# Patient Record
Sex: Male | Born: 1960 | Race: White | Hispanic: No | Marital: Single | Smoking: Current every day smoker
Health system: Southern US, Community
[De-identification: ages and names within clinical notes are randomized; demographics above are authoritative.]

## PROBLEM LIST (undated history)

## (undated) DIAGNOSIS — E039 Hypothyroidism, unspecified: Secondary | ICD-10-CM

## (undated) DIAGNOSIS — F17200 Nicotine dependence, unspecified, uncomplicated: Secondary | ICD-10-CM

## (undated) DIAGNOSIS — R001 Bradycardia, unspecified: Secondary | ICD-10-CM

## (undated) DIAGNOSIS — B182 Chronic viral hepatitis C: Secondary | ICD-10-CM

## (undated) DIAGNOSIS — G8929 Other chronic pain: Secondary | ICD-10-CM

## (undated) DIAGNOSIS — M549 Dorsalgia, unspecified: Secondary | ICD-10-CM

## (undated) HISTORY — DX: Hypothyroidism, unspecified: E03.9

## (undated) HISTORY — DX: Other chronic pain: G89.29

## (undated) HISTORY — DX: Bradycardia, unspecified: R00.1

## (undated) HISTORY — DX: Dorsalgia, unspecified: M54.9

## (undated) HISTORY — DX: Chronic viral hepatitis C: B18.2

## (undated) HISTORY — DX: Nicotine dependence, unspecified, uncomplicated: F17.200

## (undated) HISTORY — PX: MULTIPLE TOOTH EXTRACTIONS: SHX2053

---
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
## 2012-12-06 ENCOUNTER — Encounter (HOSPITAL_COMMUNITY): Payer: Self-pay | Admitting: Emergency Medicine

## 2012-12-06 ENCOUNTER — Emergency Department (HOSPITAL_COMMUNITY): Payer: Self-pay

## 2012-12-06 ENCOUNTER — Emergency Department (HOSPITAL_COMMUNITY)
Admission: EM | Admit: 2012-12-06 | Discharge: 2012-12-06 | Disposition: A | Discharge status: Home / Self Care | Payer: Self-pay | Attending: Emergency Medicine | Admitting: Emergency Medicine

## 2012-12-06 DIAGNOSIS — R112 Nausea with vomiting, unspecified: Secondary | ICD-10-CM | POA: Insufficient documentation

## 2012-12-06 DIAGNOSIS — R300 Dysuria: Secondary | ICD-10-CM | POA: Insufficient documentation

## 2012-12-06 DIAGNOSIS — N200 Calculus of kidney: Secondary | ICD-10-CM | POA: Insufficient documentation

## 2012-12-06 LAB — URINALYSIS, ROUTINE W REFLEX MICROSCOPIC
Bilirubin Urine: NEGATIVE
Glucose, UA: NEGATIVE mg/dL
Nitrite: NEGATIVE
Specific Gravity, Urine: 1.011 (ref 1.005–1.030)
pH: 6.5 (ref 5.0–8.0)

## 2012-12-06 LAB — CBC WITH DIFFERENTIAL/PLATELET
HCT: 39.8 % (ref 39.0–52.0)
Hemoglobin: 13.2 g/dL (ref 13.0–17.0)
Lymphocytes Relative: 27 % (ref 12–46)
Lymphs Abs: 2.5 10*3/uL (ref 0.7–4.0)
Monocytes Absolute: 0.7 10*3/uL (ref 0.1–1.0)
Monocytes Relative: 7 % (ref 3–12)
Neutro Abs: 5.6 10*3/uL (ref 1.7–7.7)
Neutrophils Relative %: 60 % (ref 43–77)
RBC: 4.39 MIL/uL (ref 4.22–5.81)

## 2012-12-06 LAB — BASIC METABOLIC PANEL
BUN: 17 mg/dL (ref 6–23)
CO2: 26 mEq/L (ref 19–32)
Chloride: 104 mEq/L (ref 96–112)
Creatinine, Ser: 1.35 mg/dL (ref 0.50–1.35)
GFR calc Af Amer: 67 mL/min — ABNORMAL LOW (ref >90)
Glucose, Bld: 103 mg/dL — ABNORMAL HIGH (ref 70–99)
Potassium: 4.6 mEq/L (ref 3.5–5.1)

## 2012-12-06 LAB — URINE MICROSCOPIC-ADD ON

## 2012-12-06 MED ORDER — OXYCODONE-ACETAMINOPHEN 5-325 MG PO TABS: 2.0000 | ORAL_TABLET | Freq: Four times a day (QID) | ORAL | Status: DC | PRN

## 2012-12-06 MED ORDER — TAMSULOSIN HCL 0.4 MG PO CAPS: 0.4000 mg | ORAL_CAPSULE | Freq: Every day | ORAL | Status: DC

## 2012-12-06 MED ORDER — ONDANSETRON HCL 4 MG/2ML IJ SOLN
4.0000 mg | Freq: Once | INTRAMUSCULAR | Status: AC
  Administered 2012-12-06: 4 mg via INTRAVENOUS
  Filled 2012-12-06: qty 2

## 2012-12-06 MED ORDER — MORPHINE SULFATE 4 MG/ML IJ SOLN
4.0000 mg | Freq: Once | INTRAMUSCULAR | Status: AC
  Administered 2012-12-06: 4 mg via INTRAVENOUS
  Filled 2012-12-06: qty 1

## 2012-12-06 NOTE — ED Provider Notes (Signed)
History     CSN: [id number]  Arrival date & time 12/06/12  6578   First MD Initiated Contact with Patient 12/06/12 [phone]      Chief Complaint  Patient presents with  . Flank Pain    (Consider location/radiation/quality/duration/timing/severity/associated sxs/prior treatment) HPI Comments: Patient reports to the emergency department with chief complaint of right-sided flank pain x1 day. States that yesterday morning he experienced constant, sharp, right flank pain which has persisted until now. He states that the pain currently is 5/10, but has been as bad as 10 out of 10. He endorses associated nausea and one episode of vomiting. He denies any blood in his vomit. Additionally, he endorses some dysuria, but denies any hematuria. He states that he has no other health problems. He has never felt like this before. He tried taking Tylenol with no relief. He denies headache, fever, chest pain, shortness of breath, abdominal pain, numbness and tingling of the extremities. Nothing makes his symptoms better or worse.  The history is provided by the patient. No language interpreter was used.    No past medical history on file.  No past surgical history on file.  No family history on file.  History  Substance Use Topics  . Smoking status: Not on file  . Smokeless tobacco: Not on file  . Alcohol Use: Not on file      Review of Systems  All other systems reviewed and are negative.    Allergies  Review of patient's allergies indicates not on file.  Home Medications  No current outpatient prescriptions on file.  BP 124/88  Pulse 59  Temp(Src) 97.9 F (36.6 C) (Oral)  Ht 5\' 9"  (1.753 m)  Wt 155 lb (70.308 kg)  BMI 22.88 kg/m2  SpO2 98%  Physical Exam  Nursing note and vitals reviewed. Constitutional: He is oriented to person, place, and time. He appears well-developed and well-nourished.  HENT:  Head: Normocephalic and atraumatic.  Eyes: Conjunctivae and EOM are normal.  Pupils are equal, round, and reactive to light. Right eye exhibits no discharge. Left eye exhibits no discharge. No scleral icterus.  Neck: Normal range of motion. Neck supple. No JVD present.  Cardiovascular: Normal rate, regular rhythm, normal heart sounds and intact distal pulses.  Exam reveals no gallop and no friction rub.   No murmur heard. Pulmonary/Chest: Effort normal and breath sounds normal. No respiratory distress. He has no wheezes. He has no rales. He exhibits no tenderness.  Abdominal: Soft. Bowel sounds are normal. He exhibits no distension and no mass. There is no tenderness. There is no rebound and no guarding.  CVA tenderness on the right side  Musculoskeletal: Normal range of motion. He exhibits no edema and no tenderness.  Neurological: He is alert and oriented to person, place, and time.  CN 3-12 intact  Skin: Skin is warm and dry.  Psychiatric: He has a normal mood and affect. His behavior is normal. Judgment and thought content normal.    ED Course  Procedures (including critical care time)  Labs Reviewed  CBC WITH DIFFERENTIAL  BASIC METABOLIC PANEL   Results for orders placed during the hospital encounter of 12/06/12  CBC WITH DIFFERENTIAL      Result Value Range   WBC 9.3  4.0 - 10.5 K/uL   RBC 4.39  4.22 - 5.81 MIL/uL   Hemoglobin 13.2  13.0 - 17.0 g/dL   HCT 29.5  28.4 - 13.2 %   MCV 90.7  78.0 - 100.0 fL  MCH 30.1  26.0 - 34.0 pg   MCHC 33.2  30.0 - 36.0 g/dL   RDW 16.1  09.6 - 04.5 %   Platelets 250  150 - 400 K/uL   Neutrophils Relative % 60  43 - 77 %   Neutro Abs 5.6  1.7 - 7.7 K/uL   Lymphocytes Relative 27  12 - 46 %   Lymphs Abs 2.5  0.7 - 4.0 K/uL   Monocytes Relative 7  3 - 12 %   Monocytes Absolute 0.7  0.1 - 1.0 K/uL   Eosinophils Relative 5  0 - 5 %   Eosinophils Absolute 0.5  0.0 - 0.7 K/uL   Basophils Relative 1  0 - 1 %   Basophils Absolute 0.1  0.0 - 0.1 K/uL  BASIC METABOLIC PANEL      Result Value Range   Sodium 138  135 -  145 mEq/L   Potassium 4.6  3.5 - 5.1 mEq/L   Chloride 104  96 - 112 mEq/L   CO2 26  19 - 32 mEq/L   Glucose, Bld 103 (*) 70 - 99 mg/dL   BUN 17  6 - 23 mg/dL   Creatinine, Ser 4.09  0.50 - 1.35 mg/dL   Calcium 9.5  8.4 - 81.1 mg/dL   GFR calc non Af Amer 58 (*) >90 mL/min   GFR calc Af Amer 67 (*) >90 mL/min  URINALYSIS, ROUTINE W REFLEX MICROSCOPIC      Result Value Range   Color, Urine YELLOW  YELLOW   APPearance CLOUDY (*) CLEAR   Specific Gravity, Urine 1.011  1.005 - 1.030   pH 6.5  5.0 - 8.0   Glucose, UA NEGATIVE  NEGATIVE mg/dL   Hgb urine dipstick MODERATE (*) NEGATIVE   Bilirubin Urine NEGATIVE  NEGATIVE   Ketones, ur NEGATIVE  NEGATIVE mg/dL   Protein, ur NEGATIVE  NEGATIVE mg/dL   Urobilinogen, UA 0.2  0.0 - 1.0 mg/dL   Nitrite NEGATIVE  NEGATIVE   Leukocytes, UA NEGATIVE  NEGATIVE  URINE MICROSCOPIC-ADD ON      Result Value Range   WBC, UA 0-2  <3 WBC/hpf   RBC / HPF 3-6  <3 RBC/hpf   Bacteria, UA RARE  RARE   Ct Abdomen Pelvis Wo Contrast  12/06/2012   *RADIOLOGY REPORT*  Clinical Data: Right flank pain with nausea and vomiting.  Painful urination.  CT ABDOMEN AND PELVIS WITHOUT CONTRAST  Technique:  Multidetector CT imaging of the abdomen and pelvis was performed following the standard protocol without intravenous contrast.  Comparison: None.  Findings: There is a 4 mm stone obstructing the right ureter at the right ureterovesicle junction visible on image number 70 of series 2. This creates slight right hydronephrosis and right perinephric soft tissue stranding.  There are two and 3 mm stones in the midportion of the right kidney.  There is a 1 mm stone in the lower pole of the left kidney.  The liver, spleen, pancreas, adrenal glands, and biliary tree appear normal.  The bowel appears normal including the terminal ileum and appendix.  Benign calcifications in the prostate gland and seminal vesicles.  The patient has diffuse fairly severe degenerative disc disease in the  lumbar spine with multiple disc protrusions and mild to moderate spinal stenosis at L2-3, L3-4, and L4-5.  IMPRESSION:  1.  4 mm stone obstructing the distal right ureter at the ureterovesicle junction. 2.  Bilateral renal calculi. 3.  Diffuse moderately severe degenerative disc  disease in the lumbar spine.   Original Report Authenticated By: Francene Boyers, M.D.      1. Kidney stone       MDM  Patient with right-sided flank pain. Symptoms began yesterday. He reports associated dysuria. No fevers. Will check basic labs, UA, and CT for kidney stone.  Patient with 4 mm kidney stone. No evidence of septic stone. Will discharge to home with adequate pain control, Flomax, and urology followup. Patient instructed to strain urine. Dietary modifications advised. Patient is stable and ready for discharge.        Roxy Horseman, PA-C 12/06/12 1037

## 2012-12-06 NOTE — ED Notes (Signed)
Patient  Advised he started having right flank pain yesterday.  He said his pain was a 10/10 and he was hurting when he urinated.  He also said he has frequency.  Patient says he has not been to a dr in 20years and thinks this is a kidney stone.

## 2012-12-06 NOTE — ED Provider Notes (Signed)
Medical screening examination/treatment/procedure(s) were performed by non-physician practitioner and as supervising physician I was immediately available for consultation/collaboration.   Charles B. Bernette Mayers, MD 12/06/12 1045

## 2013-06-21 ENCOUNTER — Encounter (HOSPITAL_COMMUNITY): Payer: Self-pay | Admitting: Emergency Medicine

## 2013-06-21 ENCOUNTER — Emergency Department (INDEPENDENT_AMBULATORY_CARE_PROVIDER_SITE_OTHER)
Admission: EM | Admit: 2013-06-21 | Discharge: 2013-06-21 | Disposition: A | Discharge status: Home / Self Care | Payer: Self-pay | Source: Home / Self Care | Attending: Family Medicine | Admitting: Family Medicine

## 2013-06-21 ENCOUNTER — Emergency Department (INDEPENDENT_AMBULATORY_CARE_PROVIDER_SITE_OTHER): Payer: Self-pay

## 2013-06-21 DIAGNOSIS — M25511 Pain in right shoulder: Secondary | ICD-10-CM

## 2013-06-21 DIAGNOSIS — M25519 Pain in unspecified shoulder: Secondary | ICD-10-CM

## 2013-06-21 MED ORDER — TRAMADOL HCL 50 MG PO TABS: 50.0000 mg | ORAL_TABLET | Freq: Two times a day (BID) | ORAL | Status: DC

## 2013-06-21 NOTE — ED Notes (Signed)
Patient c/o right shoulder pain after a fall about 3 weeks ago. Pain is 7/10. Stated that pain can be a 10 with certain movements. No radiation. Stated he has tried OTC medications with no relief. Written by: Marga Melnick

## 2013-06-21 NOTE — ED Provider Notes (Signed)
CSN: [id number]     Arrival date & time 06/21/13  1107 History   First MD Initiated Contact with Patient 06/21/13 1244     Chief Complaint  Patient presents with  . Shoulder Pain   (Consider location/radiation/quality/duration/timing/severity/associated sxs/prior Treatment) HPI Comments: States he tripped and fell 3 weeks ago injuring right shoulder in the process of fall. States he did not land directly on the shoulder but reached out with right hand to grab onto something while falling and strained his right shoulder. Denies previous injury or surgery.   Patient is a 52 y.o. male presenting with shoulder injury. The history is provided by the patient.  Shoulder Injury This is a new problem. Episode onset: 3 weeks ago. The problem has been gradually improving.    History reviewed. No pertinent past medical history. Past Surgical History  Procedure Laterality Date  . Feet surgery  1970   History reviewed. No pertinent family history. History  Substance Use Topics  . Smoking status: Former Smoker -- 0.50 packs/day    Types: Cigarettes  . Smokeless tobacco: Never Used  . Alcohol Use: Yes     Comment: ocasionally    Review of Systems  All other systems reviewed and are negative.    Allergies  Review of patient's allergies indicates no known allergies.  Home Medications   Current Outpatient Rx  Name  Route  Sig  Dispense  Refill  . acetaminophen (TYLENOL) 500 MG tablet   Oral   Take 1,000 mg by mouth every 6 (six) hours as needed for pain.         [name] ibuprofen (ADVIL,MOTRIN) 200 MG tablet   Oral   Take 200-400 mg by mouth every 6 (six) hours as needed for pain.         [name] oxyCODONE-acetaminophen (PERCOCET/ROXICET) 5-325 MG per tablet   Oral   Take 2 tablets by mouth every 6 (six) hours as needed for pain.   15 tablet   0   . tamsulosin (FLOMAX) 0.4 MG CAPS   Oral   Take 1 capsule (0.4 mg total) by mouth daily.   10 capsule   0    BP 134/85  Pulse 53   Temp(Src) 97.5 F (36.4 C) (Oral)  SpO2 96% Physical Exam  Nursing note and vitals reviewed. Constitutional: He is oriented to person, place, and time. He appears well-developed and well-nourished. No distress.  HENT:  Head: Normocephalic and atraumatic.  Eyes: Conjunctivae are normal. No scleral icterus.  Neck: Normal range of motion. Neck supple.  Cardiovascular: Normal rate, regular rhythm and normal heart sounds.   Pulmonary/Chest: Effort normal and breath sounds normal.  Abdominal: Soft. Bowel sounds are normal. There is no tenderness.  Musculoskeletal:       Right shoulder: He exhibits decreased range of motion, tenderness and pain. He exhibits no bony tenderness, no swelling, no effusion, no crepitus, no deformity, no laceration, no spasm, normal pulse and normal strength.       Arms: Distal pulses and sensation of RUE intact.   Neurological: He is alert and oriented to person, place, and time.  Skin: Skin is warm and dry.  Psychiatric: He has a normal mood and affect. His behavior is normal.    ED Course  Procedures (including critical care time) Labs Review Labs Reviewed - No data to display Imaging Review No results found.  EKG Interpretation    Date/Time:    Ventricular Rate:    PR Interval:    QRS Duration:  QT Interval:    QTC Calculation:   R Axis:     Text Interpretation:              MDM   Advised patient regarding RICE therapy and tramadol use at home. Stated to patient that he would benefit from follow up with orthopedist listed on discharge paperwork as exam (and normal radiographs) suggest a possible rotator cuff injury. Educated patient that this type of injury is best identified on MRI and this this type of study can be arranged by orthopedist.   Ardis Rowan, PA 06/21/13 [phone]

## 2013-06-24 NOTE — ED Provider Notes (Signed)
Medical screening examination/treatment/procedure(s) were performed by a resident physician or non-physician practitioner and as the supervising physician I was immediately available for consultation/collaboration.  Clementeen Graham, MD    Rodolph Bong, MD 06/24/13 [phone]

## 2013-11-27 ENCOUNTER — Encounter (HOSPITAL_COMMUNITY): Payer: Self-pay | Admitting: Emergency Medicine

## 2013-11-27 ENCOUNTER — Emergency Department (HOSPITAL_COMMUNITY)
Admission: EM | Admit: 2013-11-27 | Discharge: 2013-11-27 | Disposition: A | Discharge status: Home / Self Care | Payer: Medicaid Other | Attending: Emergency Medicine | Admitting: Emergency Medicine

## 2013-11-27 DIAGNOSIS — R52 Pain, unspecified: Secondary | ICD-10-CM | POA: Insufficient documentation

## 2013-11-27 DIAGNOSIS — M545 Low back pain, unspecified: Secondary | ICD-10-CM | POA: Insufficient documentation

## 2013-11-27 DIAGNOSIS — Z79899 Other long term (current) drug therapy: Secondary | ICD-10-CM | POA: Insufficient documentation

## 2013-11-27 DIAGNOSIS — G8929 Other chronic pain: Secondary | ICD-10-CM | POA: Insufficient documentation

## 2013-11-27 DIAGNOSIS — Z87891 Personal history of nicotine dependence: Secondary | ICD-10-CM | POA: Insufficient documentation

## 2013-11-27 MED ORDER — METHOCARBAMOL 500 MG PO TABS: 500.0000 mg | ORAL_TABLET | Freq: Three times a day (TID) | ORAL | Status: DC | PRN

## 2013-11-27 MED ORDER — HYDROCODONE-ACETAMINOPHEN 5-325 MG PO TABS: 1.0000 | ORAL_TABLET | ORAL | Status: DC | PRN

## 2013-11-27 MED ORDER — HYDROCODONE-ACETAMINOPHEN 5-325 MG PO TABS
1.0000 | ORAL_TABLET | Freq: Once | ORAL | Status: AC
  Administered 2013-11-27: 1 via ORAL
  Filled 2013-11-27: qty 1

## 2013-11-27 NOTE — ED Provider Notes (Signed)
Medical screening examination/treatment/procedure(s) were performed by non-physician practitioner and as supervising physician I was immediately available for consultation/collaboration.    Linwood Dibbles, MD 11/27/13 [phone]

## 2013-11-27 NOTE — ED Notes (Addendum)
Pt c/o back pain that worsen yesterday, has issues with back. Pt states he got out of bed yesterday and back gave out. Denies urinary problems.

## 2013-11-27 NOTE — ED Provider Notes (Signed)
CSN: [id number]     Arrival date & time 11/27/13  0945 History   First MD Initiated Contact with Patient 11/27/13 [phone]     Chief Complaint  Patient presents with  . Back Pain     (Consider location/radiation/quality/duration/timing/severity/associated sxs/prior Treatment) HPI  Patient with hx chronic back pain newly on disability p/w exacerbation of pain that began yesterday morning.  States he got out of bed and dropped to his knees from pain.  The pain is intermittent, occurs suddenly and is described as a grabbing and tight pain located in his left lower back.  Pain does not radiate. States his pain previously was 10/10 daily but this is worse.  Takes tylenol and ibuprofen without improvement.  Denies fevers, chills, abdominal pain, loss of control or retention of bowel or bladder, weakness of numbness of the extremities, saddle anesthesia, bowel, or urinary complaints.  Has hx kidney stones states this is nothing like that, states this feels like a much worse version of his typical low back pain.  Denies any new injury to his back but did carry groceries the night before his pain began.    Per CT abd/pelvis 2014 pt has severe DDD with multiple disc protrusions and mild to moderate spinal stenosis of lumbar spine.     History reviewed. No pertinent past medical history. Past Surgical History  Procedure Laterality Date  . Feet surgery  1970   No family history on file. History  Substance Use Topics  . Smoking status: Former Smoker -- 0.50 packs/day    Types: Cigarettes  . Smokeless tobacco: Never Used  . Alcohol Use: Yes     Comment: ocasionally    Review of Systems  All other systems reviewed and are negative.     Allergies  Review of patient's allergies indicates no known allergies.  Home Medications   Prior to Admission medications   Medication Sig Start Date End Date Taking? Authorizing Provider  acetaminophen (TYLENOL) 500 MG tablet Take 1,000 mg by mouth every 6  (six) hours as needed for pain.    Historical Provider, MD  ibuprofen (ADVIL,MOTRIN) 200 MG tablet Take 200-400 mg by mouth every 6 (six) hours as needed for pain.    Historical Provider, MD  oxyCODONE-acetaminophen (PERCOCET/ROXICET) 5-325 MG per tablet Take 2 tablets by mouth every 6 (six) hours as needed for pain. 12/06/12   Roxy Horsemanobert Browning, PA-C  tamsulosin (FLOMAX) 0.4 MG CAPS Take 1 capsule (0.4 mg total) by mouth daily. 12/06/12   Roxy Horsemanobert Browning, PA-C  traMADol (ULTRAM) 50 MG tablet Take 1 tablet (50 mg total) by mouth 2 (two) times daily. 06/21/13   Jess BartersJennifer Lee Presson, PA   BP 140/83  Pulse 54  Temp(Src) 97.8 F (36.6 C) (Oral)  Resp 16  SpO2 100% Physical Exam  Nursing note and vitals reviewed. Constitutional: He appears well-developed and well-nourished. No distress.  HENT:  Head: Normocephalic and atraumatic.  Neck: Neck supple.  Pulmonary/Chest: Effort normal.  Abdominal: Soft. He exhibits no distension and no mass. There is no tenderness. There is no rebound and no guarding.  Musculoskeletal:       Back:  Spine nontender, no crepitus, or stepoffs. Lower extremities:  Strength 5/5, sensation intact. Gait is normal.   Neurological: He is alert.  Skin: He is not diaphoretic.    ED Course  Procedures (including critical care time) Labs Review Labs Reviewed - No data to display  Imaging Review No results found.   EKG Interpretation None  Pt checked on DEA database.  He has had no controlled substances prescribed in [place] in the past 6 months.   MDM   Final diagnoses:  Acute exacerbation of chronic low back pain    Afebrile, nontoxic patient with mechanical low back pain in the setting of existing chronic low back pain with no new injuries.  Suspect muscle spasms. No red flags. I do not believe emergent imaging is indicated at this time.  D/C home with norco, robaxin, neurosurgery and PCP follow up.  Discussed result, findings, treatment, and follow up  with  patient.  Pt given return precautions.  Pt verbalizes understanding and agrees with plan.         Trixie Dredge, PA-C 11/27/13 1024

## 2013-11-27 NOTE — Discharge Instructions (Signed)
Read the information below.  Use the prescribed medication as directed.  Please discuss all new medications with your pharmacist.  Do not take additional tylenol while taking the prescribed pain medication to avoid overdose.  You may return to the Emergency Department at any time for worsening condition or any new symptoms that concern you.   If you develop fevers, loss of control of bowel or bladder, weakness or numbness in your legs, or are unable to walk, return to the ER for a recheck.    Chronic Back Pain  When back pain lasts longer than 3 months, it is called chronic back pain.People with chronic back pain often go through certain periods that are more intense (flare-ups).  CAUSES Chronic back pain can be caused by wear and tear (degeneration) on different structures in your back. These structures include:  The bones of your spine (vertebrae) and the joints surrounding your spinal cord and nerve roots (facets).  The strong, fibrous tissues that connect your vertebrae (ligaments). Degeneration of these structures may result in pressure on your nerves. This can lead to constant pain. HOME CARE INSTRUCTIONS  Avoid bending, heavy lifting, prolonged sitting, and activities which make the problem worse.  Take brief periods of rest throughout the day to reduce your pain. Lying down or standing usually is better than sitting while you are resting.  Take over-the-counter or prescription medicines only as directed by your caregiver. SEEK IMMEDIATE MEDICAL CARE IF:   You have weakness or numbness in one of your legs or feet.  You have trouble controlling your bladder or bowels.  You have nausea, vomiting, abdominal pain, shortness of breath, or fainting. Document Released: 07/30/2004 Document Revised: 09/14/2011 Document Reviewed: 06/06/2011 [facility] Patient Information 2014 [place], [place].  Back Pain, Adult Low back pain is very common. About 1 in 5 people have back pain.The cause of  low back pain is rarely dangerous. The pain often gets better over time.About half of people with a sudden onset of back pain feel better in just 2 weeks. About 8 in 10 people feel better by 6 weeks.  CAUSES Some common causes of back pain include:  Strain of the muscles or ligaments supporting the spine.  Wear and tear (degeneration) of the spinal discs.  Arthritis.  Direct injury to the back. DIAGNOSIS Most of the time, the direct cause of low back pain is not known.However, back pain can be treated effectively even when the exact cause of the pain is unknown.Answering your caregiver's questions about your overall health and symptoms is one of the most accurate ways to make sure the cause of your pain is not dangerous. If your caregiver needs more information, he or she may order lab work or imaging tests (X-rays or MRIs).However, even if imaging tests show changes in your back, this usually does not require surgery. HOME CARE INSTRUCTIONS For many people, back pain returns.Since low back pain is rarely dangerous, it is often a condition that people can learn to [facility] their own.   Remain active. It is stressful on the back to sit or stand in one place. Do not sit, drive, or stand in one place for more than 30 minutes at a time. Take short walks on level surfaces as soon as pain allows.Try to increase the length of time you walk each day.  Do not stay in bed.Resting more than 1 or 2 days can delay your recovery.  Do not avoid exercise or work.Your body is made to move.It is not  dangerous to be active, even though your back may hurt.Your back will likely heal faster if you return to being active before your pain is gone.  Pay attention to your body when you bend and lift. Many people have less discomfortwhen lifting if they bend their knees, keep the load close to their bodies,and avoid twisting. Often, the most comfortable positions are those that put less stress on your  recovering back.  Find a comfortable position to sleep. Use a firm mattress and lie on your side with your knees slightly bent. If you lie on your back, put a pillow under your knees.  Only take over-the-counter or prescription medicines as directed by your caregiver. Over-the-counter medicines to reduce pain and inflammation are often the most helpful.Your caregiver may prescribe muscle relaxant drugs.These medicines help dull your pain so you can more quickly return to your normal activities and healthy exercise.  Put ice on the injured area.  Put ice in a plastic bag.  Place a towel between your skin and the bag.  Leave the ice on for 15-20 minutes, 03-04 times a day for the first 2 to 3 days. After that, ice and heat may be alternated to reduce pain and spasms.  Ask your caregiver about trying back exercises and gentle massage. This may be of some benefit.  Avoid feeling anxious or stressed.Stress increases muscle tension and can worsen back pain.It is important to recognize when you are anxious or stressed and learn ways to manage it.Exercise is a great option. SEEK MEDICAL CARE IF:  You have pain that is not relieved with rest or medicine.  You have pain that does not improve in 1 week.  You have new symptoms.  You are generally not feeling well. SEEK IMMEDIATE MEDICAL CARE IF:   You have pain that radiates from your back into your legs.  You develop new bowel or bladder control problems.  You have unusual weakness or numbness in your arms or legs.  You develop nausea or vomiting.  You develop abdominal pain.  You feel faint. Document Released: 06/22/2005 Document Revised: 12/22/2011 Document Reviewed: 11/10/2010 [facility] Patient Information 2014 [place], [place].   Emergency Department Resource Guide 1) Find a Doctor and Pay Out of Pocket Although you won't have to find out who is covered by your insurance plan, it is a good idea to ask around and get  recommendations. You will then need to call the office and see if the doctor you have chosen will accept you as a new patient and what types of options they offer for patients who are self-pay. Some doctors offer discounts or will set up payment plans for their patients who do not have insurance, but you will need to ask so you aren't surprised when you get to your appointment.  2) Contact Your Local Health Department Not all health departments have doctors that can see patients for sick visits, but many do, so it is worth a call to see if yours does. If you don't know where your local health department is, you can check in your phone book. The CDC also has a tool to help you locate your state's health department, and many state websites also have listings of all of their local health departments.  3) Find a Walk-in Clinic If your illness is not likely to be very severe or complicated, you may want to try a walk in clinic. These are popping up all over the country in pharmacies, drugstores, and shopping centers. They're  usually staffed by nurse practitioners or physician assistants that have been trained to treat common illnesses and complaints. They're usually fairly quick and inexpensive. However, if you have serious medical issues or chronic medical problems, these are probably not your best option.  No Primary Care Doctor: - Call Health Connect at  [phone] - they can help you locate a primary care doctor that  accepts your insurance, provides certain services, etc. - Physician Referral Service- [phone]  Chronic Pain Problems: Organization         Address  Phone   Notes  [facility]  [phone] Patients need to be referred by their primary care doctor.   Medication Assistance: Organization         Address  Phone   Notes  [facility] [address] [place] [place], [place] [address] [phone] --Must be a resident of  [facility] -- Must have NO insurance coverage whatsoever (no Medicaid/ Medicare, etc.) -- The pt. MUST have a primary care doctor that directs their care regularly and follows them in the community   [facility]ist  [phone]   [employer]  [phone]    Agencies that provide inexpensive medical care: Organization         Address  Phone   Notes  [facility]  [phone]   [facility]    [phone]   [facility] [address] [place], [place] [address] [phone]   [facility] [address] [place] [phone]   Planned Parenthood    [phone]   [facility]    [phone]   [facility] and [facility]  [address], [place] Phone:  [phone], Fax:  [phone] Hours of Operation:  9 am - 6 pm, M-F.  Also accepts Medicaid/Medicare and self-pay.  [facility]  [address], [place] Phone: [phone], Fax: [phone]. Hours of Operation:  8:30 am - 5:30 pm, M-F.  Also accepts Medicaid and self-pay.  [facility] [place] [address], [place] Phone: [phone]   [facility] [address] Natasha Bence [place], [place] [phone], Ext. [phone] Mondays & Thursdays: 7-9 AM.  First 15 patients are seen on a first come, first serve basis.    Medicaid-accepting [facility] Providers:  Organization         Address  Phone   Notes  [facility] [address], [place] [phone] Also accepts self-pay patients.  [facility] [address] Laurell Josephs [place], [place]  [phone]   [facility] [address], [place] [phone]   [facility] [address] [place] [phone]   Renaye Rakers [address], [place]   [phone] Only accepts [place] Access  [place] patients after they have their name applied to their card.   Self-Pay (no insurance) in [facility]:  Organization         Address  Phone   Notes  Sickle Cell Patients, [facility] [address] [place], [place] [phone]   [facility] [address] [place], [place] [phone]   [facility] [place]  [address], [place] [phone]   [facility]/Dr. Osei-Bonsu  [address] [place] or [address], [place] [phone] Phone number for both [place] and [place] locations is the same.  Urgent Medical and [facility] [address] [place] [phone]   [facility] [address], [place] or [address] [phone]   [facility] [address], [place] [phone], phone; [phone], fax Sees patients 1st and 3rd Saturday of every month.  Must not qualify for public or private insurance (i.e. Medicaid, Medicare, [place] Health Choice, Veterans' Benefits)  Household income should be no more than 200% of the poverty level The clinic cannot treat you if you are pregnant or think you are pregnant  Sexually transmitted diseases are not treated at the clinic.    Dental Care: Organization         Address  Phone  Notes  [facility] of [facility] [address] Mayo Faulk [place], [place] [phone] Accepts children up to age 45 who are enrolled in [place] or [place] Health Choice; pregnant women with a Medicaid card; and children who have applied for Medicaid or [place] Health Choice, but were declined, whose parents can pay a reduced fee at time of service.  [facility] Department of [facility]  [address], [place] [phone] Accepts children up to age 13 who are enrolled in [place] or [place] Health Choice; pregnant women with a Medicaid  card; and children who have applied for Medicaid or [place] Health Choice, but were declined, whose parents can pay a reduced fee at time of service.  [facility]  [address] [place], [place] [phone] Patients are seen by appointment only. Walk-ins are not accepted. [facility] will see patients 58 years of age and older. Monday - Tuesday (8am-5pm) Most Wednesdays (8:30-5pm) $30 per visit, cash only  [facility] Adult Dental Access PROGRAM  [address], [facility] [phone] Patients are seen by appointment only. Walk-ins are not accepted. [facility] will see patients 58 years of age and older. One Wednesday Evening (Monthly: Volunteer Based).  $30 per visit, cash only  [employer]  [phone] for adults; Children under age 24, call Graduate Pediatric Dentistry at [phone]. Children aged 41-14, please call [phone] to request a pediatric application.  Dental services are provided in all areas of dental care including fillings, crowns and bridges, complete and partial dentures, implants, gum treatment, root canals, and extractions. Preventive care is also provided. Treatment is provided to both adults and children. Patients are selected via a lottery and there is often a waiting list.   [facility] [address] [place]  [phone] [email or web address]   [facility] [address] [place], [place] [phone], Ext. [phone] Second and Fourth Thursday of each month, opens at 6:30 AM; Clinic ends at 9 AM.  Patients are seen on a first-come first-served basis, and a limited number are seen during each clinic.   [facility]  [address] Ether Griffins [place], [place] [phone]   Eligibility Requirements You must have lived in [place], [place], or [place] counties for at least the last three months.   You cannot be eligible for state or federal sponsored [place],  including [employer], [place], or [employer].   You generally cannot be eligible for healthcare insurance through  your employer.    How to apply: Eligibility screenings are held every Tuesday and Wednesday afternoon from 1:00 pm until 4:00 pm. You do not need an appointment for the interview!  [facility] [address], [place], [place] [phone]   [facility] Health Department  [phone]   [facility] Health Department  [phone]   [facility] Health Department  [phone]    Behavioral Health Resources in the Community: Intensive Outpatient Programs Organization         Address  Phone  Notes  [facility] [address] [place] [address] [place], [place] [phone]   [facility]patient [address], [place], [place] [phone]   [facility] [address] [place], [place]  [phone]   [facility] [address]  [place], [place] [phone] or [phone]   Substance Abuse Resources Organization         Address  Phone  Notes  [facility]  ([phone]   [facility]  [phone]   [facility] [place]  [phone]   Floydene Flock  [phone]   Residential & Outpatient Substance Abuse Program  [phone]   Psychological Services Organization         Address  Phone  Notes  [facility]  [phone]   [facility]  [phone]   [facility] [address], [place] [phone] or [phone]    Mobile Crisis Teams Organization         Address  Phone  Notes  [facility], Mobile Crisis Care Unit  [phone]   [facility]  [address] [place], [place] [phone]   Doristine Locks [address] [place] [phone]    Self-Help/Support Groups Organization         Address  Phone             Notes  [facility]ental [facility] Assoc.  of [place] - variety of support groups  336- I7437963 Call for more information  Narcotics Anonymous (NA), [facility] [address], [employer] [place]  2 meetings at this location   Statistician         Address  Phone  Notes  [facility] [address],    [place]  [phone]   [facility]  [address], [place] [address], [place], [place] [phone]   [facility] [address] [place], [place] [phone] Admissions: 8am-3pm M-F  [facility] [address],    [place], [place] [phone]   [facility] [address] [place], [place], [place] [phone]   [facility] [address],  [place], [place] [phone]   [facility] - Intensive Outpatient [address] Laurell Josephs [address], [place], [place] [phone]   [facility] (Addiction Recovery [facility] [address]  [place], [place] [phone] or [phone]   [facility] ([facility]) [address] [place], [place] [phone] Accepts Medicaid  [facility] [address]  [place] [phone] Substance Abuse/Addiction Treatment   [facility] Organization         Address  Phone  Notes  [facility]  [phone]   Angie Fava, PhD [address] [place], [place]   [phone] or [phone]   [facility]   [address] [place], [place] [phone]  [facility] [address], [place], [place] [phone]) [phone] Insurance/Medicaid/sponsorship through [employer]  [facility] [address]                                    [place], [place] [phone]) [phone] Therapy/tele-psych/case  [facility] [address]   [place], [place] [phone]) [phone]    Dr. Lolly MustacheArfeen  [phone]) [phone]   [facility] [place]  [facility] 1) [address], [place] 2)  [address], [place] 3)  [address], [place] [phone]) [phone](336) [phone]  [phone]) [phone]   [facility] Child Abuse Hotline [phone](336) ([phone] or [phone](336) [phone] (After Hours)

## 2015-01-17 ENCOUNTER — Encounter: Payer: Self-pay | Admitting: Family Medicine

## 2015-01-17 ENCOUNTER — Ambulatory Visit (INDEPENDENT_AMBULATORY_CARE_PROVIDER_SITE_OTHER): Payer: Medicaid Other | Admitting: Family Medicine

## 2015-01-17 VITALS — BP 112/77 | HR 49 | Temp 97.4°F | Resp 16 | Ht 69.0 in | Wt 154.0 lb

## 2015-01-17 DIAGNOSIS — Z9189 Other specified personal risk factors, not elsewhere classified: Secondary | ICD-10-CM | POA: Diagnosis not present

## 2015-01-17 DIAGNOSIS — N289 Disorder of kidney and ureter, unspecified: Secondary | ICD-10-CM | POA: Diagnosis not present

## 2015-01-17 DIAGNOSIS — M5442 Lumbago with sciatica, left side: Secondary | ICD-10-CM | POA: Diagnosis not present

## 2015-01-17 DIAGNOSIS — Z87898 Personal history of other specified conditions: Secondary | ICD-10-CM

## 2015-01-17 DIAGNOSIS — D649 Anemia, unspecified: Secondary | ICD-10-CM

## 2015-01-17 LAB — CBC WITH DIFFERENTIAL/PLATELET
BASOS PCT: 1 % (ref 0–1)
Basophils Absolute: 0.1 10*3/uL (ref 0.0–0.1)
EOS ABS: 0.4 10*3/uL (ref 0.0–0.7)
Eosinophils Relative: 5 % (ref 0–5)
HEMATOCRIT: 38.2 % — AB (ref 39.0–52.0)
Hemoglobin: 12.8 g/dL — ABNORMAL LOW (ref 13.0–17.0)
LYMPHS ABS: 3 10*3/uL (ref 0.7–4.0)
Lymphocytes Relative: 42 % (ref 12–46)
MCH: 30.3 pg (ref 26.0–34.0)
MCHC: 33.5 g/dL (ref 30.0–36.0)
MCV: 90.5 fL (ref 78.0–100.0)
MONO ABS: 0.5 10*3/uL (ref 0.1–1.0)
MPV: 10.5 fL (ref 8.6–12.4)
Monocytes Relative: 7 % (ref 3–12)
NEUTROS ABS: 3.2 10*3/uL (ref 1.7–7.7)
NEUTROS PCT: 45 % (ref 43–77)
Platelets: 234 10*3/uL (ref 150–400)
RBC: 4.22 MIL/uL (ref 4.22–5.81)
RDW: 16.1 % — ABNORMAL HIGH (ref 11.5–15.5)
WBC: 7.1 10*3/uL (ref 4.0–10.5)

## 2015-01-17 LAB — COMPLETE METABOLIC PANEL WITH GFR
ALK PHOS: 49 U/L (ref 39–117)
ALT: 40 U/L (ref 0–53)
AST: 49 U/L — ABNORMAL HIGH (ref 0–37)
Albumin: 3.9 g/dL (ref 3.5–5.2)
BILIRUBIN TOTAL: 0.4 mg/dL (ref 0.2–1.2)
BUN: 12 mg/dL (ref 6–23)
CO2: 25 meq/L (ref 19–32)
CREATININE: 1.54 mg/dL — AB (ref 0.50–1.35)
Calcium: 9 mg/dL (ref 8.4–10.5)
Chloride: 104 mEq/L (ref 96–112)
GFR, Est African American: 57 mL/min — ABNORMAL LOW
GFR, Est Non African American: 50 mL/min — ABNORMAL LOW
GLUCOSE: 89 mg/dL (ref 70–99)
Potassium: 3.9 mEq/L (ref 3.5–5.3)
Sodium: 140 mEq/L (ref 135–145)
TOTAL PROTEIN: 7.3 g/dL (ref 6.0–8.3)

## 2015-01-17 LAB — LIPID PANEL
CHOLESTEROL: 364 mg/dL — AB (ref 0–200)
HDL: 40 mg/dL (ref >=40)
LDL Cholesterol: 276 mg/dL — ABNORMAL HIGH (ref 0–99)
Total CHOL/HDL Ratio: 9.1 Ratio
Triglycerides: 239 mg/dL — ABNORMAL HIGH (ref <150)
VLDL: 48 mg/dL — AB (ref 0–40)

## 2015-01-17 MED ORDER — MELOXICAM 15 MG PO TABS: 15.0000 mg | ORAL_TABLET | Freq: Every day | ORAL | Status: DC

## 2015-01-17 NOTE — Progress Notes (Signed)
Patient ID: Javier Bridges, male   DOB: April 05, 1961, 54 y.o.   MRN: [id number]   Javier Bridges, is a 54 y.o. male  HYQ:[id number]  [id number]:[id number]  DOB - [birth year]  CC:  Chief Complaint  Patient presents with  . Establish Care    lower back pain        HPI: Javier Bridges is a 54 y.o. male here to establish care. He has a history of low back pain and that is his primary concern today.  He has had imaging studies showing severe DDD, with disk protrusion and some spinal stenosis. He claims to be healthy otherwise.He has had no routine health carre maintenace is some time. He has been treated with Tylenol, ibuprofen, and Norco for his back pain.He does have a  History of a decreased GFR in 2014.  No Known Allergies No past medical history on file. Current Outpatient Prescriptions on File Prior to Visit  Medication Sig Dispense Refill  . acetaminophen (TYLENOL) 500 MG tablet Take 1,000 mg by mouth every 6 (six) hours as needed for pain.    [name] HYDROcodone-acetaminophen (NORCO/VICODIN) 5-325 MG per tablet Take 1 tablet by mouth every 4 (four) hours as needed for moderate pain or severe pain. (Patient not taking: Reported on 01/17/2015) 15 tablet 0  . ibuprofen (ADVIL,MOTRIN) 200 MG tablet Take 200-400 mg by mouth every 6 (six) hours as needed for pain.    . methocarbamol (ROBAXIN) 500 MG tablet Take 1 tablet (500 mg total) by mouth every 8 (eight) hours as needed for muscle spasms. (Patient not taking: Reported on 01/17/2015) 20 tablet 0  . oxyCODONE-acetaminophen (PERCOCET/ROXICET) 5-325 MG per tablet Take 2 tablets by mouth every 6 (six) hours as needed for pain. (Patient not taking: Reported on 01/17/2015) 15 tablet 0  . tamsulosin (FLOMAX) 0.4 MG CAPS Take 1 capsule (0.4 mg total) by mouth daily. (Patient not taking: Reported on 01/17/2015) 10 capsule 0  . traMADol (ULTRAM) 50 MG tablet Take 1 tablet (50 mg total) by mouth 2 (two) times daily. (Patient not taking: Reported on 01/17/2015) 15 tablet 0    No current facility-administered medications on file prior to visit.   No family history on file. History   Social History  . Marital Status: Single    Spouse Name: N/A  . Number of Children: N/A  . Years of Education: N/A   Occupational History  . Not on file.   Social History Main Topics  . Smoking status: Former Smoker -- 0.50 packs/day    Types: Cigarettes  . Smokeless tobacco: Never Used  . Alcohol Use: Yes     Comment: ocasionally  . Drug Use: Yes    Special: Marijuana  . Sexual Activity: No   Other Topics Concern  . Not on file   Social History Narrative  . No narrative on file    Review of Systems: Constitutional: Negative for fever, chills, appetite change, weight loss,  fatigue. HENT: Negative for ear pain, ear discharge.nose bleeds Eyes: Negative for pain, discharge, redness, itching and visual disturbance. Wears glasses Neck: Positive for some pain and stiffness Respiratory: Negative for cough, shortness of breath,   Cardiovascular: Negative for chest pain, palpitations and leg swelling. Gastrointestinal: Negative for abdominal distention, abdominal pain, nausea, vomiting, diarrhea, constipations Genitourinary: Negative for dysuria, urgency, frequency, hematuria, flank pain,  Musculoskeletal: Positive for low back pain, joint pain, joint  swelling, arthralgia and gait problem.Negative for weakness.Positive for hand pain, knee pain (l). Intermittent shoulder and upper back pain.  Neurological: Negative for dizziness, tremors, seizures, syncope,   light-headedness, numbness and headaches.  Hematological: Negative for easy bruising or bleeding Psychiatric/Behavioral: Negative for depression, anxiety, decreased concentration, confusion   Objective:   Filed Vitals:   01/17/15 0921  BP: 112/77  Pulse: 49  Temp: 97.4 F (36.3 C)  Resp: 16    Physical Exam: Constitutional: Patient appears well-developed and well-nourished. No distress. HENT:  Normocephalic, atraumatic, External right and left ear normal. Oropharynx is clear and moist.  Eyes: Conjunctivae and EOM are normal. PERRLA, no scleral icterus. Neck: Normal ROM. Neck supple. No lymphadenopathy, No thyromegaly. CVS: RRR, S1/S2 +, no murmurs, no gallops, no rubs Pulmonary: Effort and breath sounds normal, no stridor, rhonchi, wheezes, rales.  Abdominal: Soft. Normoactive BS,, no distension, tenderness, rebound or guarding.  Musculoskeletal: Normal range of motion. No edema. Positive for tenderness over the lower back. Fingers are gnarled. Neuro: Alert.Normal muscle tone coordination. Non-focal Skin: Skin is warm and dry. No rash noted. Not diaphoretic. No erythema. No pallor. Psychiatric: Normal mood and affect. Behavior, judgment, thought content normal.  Lab Results  Component Value Date   WBC 9.3 12/06/2012   HGB 13.2 12/06/2012   HCT 39.8 12/06/2012   MCV 90.7 12/06/2012   PLT 250 12/06/2012   Lab Results  Component Value Date   CREATININE 1.35 12/06/2012   BUN 17 12/06/2012   NA 138 12/06/2012   K 4.6 12/06/2012   CL 104 12/06/2012   CO2 26 12/06/2012    No results found for: HGBA1C Lipid Panel  No results found for: CHOL, TRIG, HDL, CHOLHDL, VLDL, LDLCALC     Assessment and plan:   Osteoarthritis with back, neck, knee and hand pain. - A trial of Meloxicam 15 mg daily  Health maintenance -Cmet with GFR -CBC with diff -lipid panel -PSA  Follow-up in two week to assess effectiveness of meloxicam.  The patient was given clear instructions to go to ER or return to medical center if symptoms don't improve, worsen or new problems develop. The patient verbalized understanding. The patient was told to call to get lab results if they haven't heard anything in the next week.       Henrietta HooverLinda C. Bernhardt, MSN, FNP-BC   01/17/2015, 9:38 AM

## 2015-01-17 NOTE — Patient Instructions (Signed)
Take Meloxicam 15 mg once a day for back pain. Take with food. Return in 2 weeks for follow-up to see how well is working We are doing bloodwork today. We will let you know if anything needs attention. When you return 2 weeks we need to address your health maintenance activities, such as immunizations and screening.

## 2015-01-18 ENCOUNTER — Other Ambulatory Visit: Payer: Self-pay | Admitting: Family Medicine

## 2015-01-18 DIAGNOSIS — M549 Dorsalgia, unspecified: Secondary | ICD-10-CM | POA: Insufficient documentation

## 2015-01-18 DIAGNOSIS — N289 Disorder of kidney and ureter, unspecified: Secondary | ICD-10-CM | POA: Insufficient documentation

## 2015-01-18 DIAGNOSIS — D649 Anemia, unspecified: Secondary | ICD-10-CM | POA: Insufficient documentation

## 2015-01-18 LAB — PSA: PSA: 0.24 ng/mL (ref <=4.00)

## 2015-01-18 MED ORDER — SIMVASTATIN 20 MG PO TABS: 20.0000 mg | ORAL_TABLET | Freq: Every day | ORAL | Status: DC

## 2015-01-31 ENCOUNTER — Encounter: Payer: Self-pay | Admitting: Family Medicine

## 2015-01-31 ENCOUNTER — Ambulatory Visit (INDEPENDENT_AMBULATORY_CARE_PROVIDER_SITE_OTHER): Payer: Medicaid Other | Admitting: Family Medicine

## 2015-01-31 VITALS — BP 117/70 | HR 53 | Temp 98.2°F | Resp 16 | Ht 69.0 in | Wt 159.0 lb

## 2015-01-31 DIAGNOSIS — M545 Low back pain, unspecified: Secondary | ICD-10-CM

## 2015-01-31 MED ORDER — TRAMADOL HCL 50 MG PO TABS: 50.0000 mg | ORAL_TABLET | Freq: Three times a day (TID) | ORAL | Status: DC | PRN

## 2015-01-31 NOTE — Progress Notes (Signed)
Subjective:     Patient ID: Javier Bridges, male   DOB: [birth year], 54 y.o.   MRN: [id number]  HPI   Patient presents today for a follow-up of back pain. I saw his 2 weeks ago and prescribed a trial of naproxen which he has not found helpful. He has a history DDD and bulging disk. This has been a problem for a number of years. He has used other medications in the past but unsure what medications were prescribed.   Review of Systems   Denies fever, chills, wt lose, lose of appetite Denies skin rashes or sores Denies chest pain or palpitations Deneis Shortness of breath or coughing Denies nausea, vomiting, diarrhea Admits to low back pain with difficulty ambulating because of the pain.    Objective:   Physical Exam   Alert, oriented, appropriate in no acute distress Skin warm and dry Neck supple FROM w/o adenopathy or tenderness Lungs clear to auscultation Hs are regular w/o murmur gallop or rub. He has pain with bending or twisting of his back. Some pain with walking.     Assessment Plan     Low back pain -trial of Tramadol 50 mg, #30, one po tid prn. -He is to call in 2 weeks to let me know if this has helped enough to want a refill.  Health maintenance -reviewed labs drawn on last visit.    Henrietta Hoover, FNP-BC

## 2015-01-31 NOTE — Patient Instructions (Signed)
Avoid heavy lifting, pushing and pulling, use good body mechanics. Try to sleep in positions we discussed. Let me know in two weeks if the Tramadol is helping enough to want to continue it.

## 2015-02-25 ENCOUNTER — Encounter: Payer: Self-pay | Admitting: Family Medicine

## 2015-05-03 ENCOUNTER — Encounter: Payer: Self-pay | Admitting: Family Medicine

## 2015-05-03 ENCOUNTER — Ambulatory Visit (INDEPENDENT_AMBULATORY_CARE_PROVIDER_SITE_OTHER): Payer: Medicaid Other | Admitting: Family Medicine

## 2015-05-03 VITALS — BP 113/76 | HR 62 | Temp 97.8°F | Resp 16 | Ht 69.0 in | Wt 158.0 lb

## 2015-05-03 DIAGNOSIS — Z0000 Encounter for general adult medical examination without abnormal findings: Secondary | ICD-10-CM

## 2015-05-03 DIAGNOSIS — K029 Dental caries, unspecified: Secondary | ICD-10-CM | POA: Diagnosis not present

## 2015-05-03 DIAGNOSIS — Z23 Encounter for immunization: Secondary | ICD-10-CM

## 2015-05-03 DIAGNOSIS — M545 Low back pain, unspecified: Secondary | ICD-10-CM

## 2015-05-03 DIAGNOSIS — F17200 Nicotine dependence, unspecified, uncomplicated: Secondary | ICD-10-CM | POA: Diagnosis not present

## 2015-05-03 MED ORDER — AMOXICILLIN 500 MG PO CAPS: 500.0000 mg | ORAL_CAPSULE | Freq: Two times a day (BID) | ORAL | Status: DC

## 2015-05-03 MED ORDER — GABAPENTIN 300 MG PO CAPS: 300.0000 mg | ORAL_CAPSULE | Freq: Three times a day (TID) | ORAL | Status: DC

## 2015-05-03 NOTE — Patient Instructions (Addendum)
I suspect patient has a dental abscess. Will start amoxicillin 500 mg BID for 10 days. Also, recommend Ibuprofen 600 mg every 6 hours for tooth pain. Dr. Tresa MooreArun Sharda [phone] General Dentist that accepts medicaid  Start 1 month trial of gabapentin 300 mg three times per day for back pain with sciatica.   Patient to follow up in 1 month.    Chronic Back Pain  When back pain lasts longer than 3 months, it is called chronic back pain.People with chronic back pain often go through certain periods that are more intense (flare-ups).  CAUSES Chronic back pain can be caused by wear and tear (degeneration) on different structures in your back. These structures include:  The bones of your spine (vertebrae) and the joints surrounding your spinal cord and nerve roots (facets).  The strong, fibrous tissues that connect your vertebrae (ligaments). Degeneration of these structures may result in pressure on your nerves. This can lead to constant pain. HOME CARE INSTRUCTIONS  Avoid bending, heavy lifting, prolonged sitting, and activities which make the problem worse.  Take brief periods of rest throughout the day to reduce your pain. Lying down or standing usually is better than sitting while you are resting.  Take over-the-counter or prescription medicines only as directed by your caregiver. SEEK IMMEDIATE MEDICAL CARE IF:   You have weakness or numbness in one of your legs or feet.  You have trouble controlling your bladder or bowels.  You have nausea, vomiting, abdominal pain, shortness of breath, or fainting.   This information is not intended to replace advice given to you by your health care provider. Make sure you discuss any questions you have with your health care provider.   Document Released: 07/30/2004 Document Revised: 09/14/2011 Document Reviewed: 12/10/2014 Elsevier Interactive Patient Education 2016 [employer] Sciatica Sciatica is pain, weakness, numbness, or tingling  along the path of the sciatic nerve. The nerve starts in the lower back and runs down the back of each leg. The nerve controls the muscles in the lower leg and in the back of the knee, while also providing sensation to the back of the thigh, lower leg, and the sole of your foot. Sciatica is a symptom of another medical condition. For instance, nerve damage or certain conditions, such as a herniated disk or bone spur on the spine, pinch or put pressure on the sciatic nerve. This causes the pain, weakness, or other sensations normally associated with sciatica. Generally, sciatica only affects one side of the body. CAUSES   Herniated or slipped disc.  Degenerative disk disease.  A pain disorder involving the narrow muscle in the buttocks (piriformis syndrome).  Pelvic injury or fracture.  Pregnancy.  Tumor (rare). SYMPTOMS  Symptoms can vary from mild to very severe. The symptoms usually travel from the low back to the buttocks and down the back of the leg. Symptoms can include:  Mild tingling or dull aches in the lower back, leg, or hip.  Numbness in the back of the calf or sole of the foot.  Burning sensations in the lower back, leg, or hip.  Sharp pains in the lower back, leg, or hip.  Leg weakness.  Severe back pain inhibiting movement. These symptoms may get worse with coughing, sneezing, laughing, or prolonged sitting or standing. Also, being overweight may worsen symptoms. DIAGNOSIS  Your caregiver will perform a physical exam to look for common symptoms of sciatica. He or she may ask you to do certain movements or activities that would trigger  sciatic nerve pain. Other tests may be performed to find the cause of the sciatica. These may include:  Blood tests.  X-rays.  Imaging tests, such as an MRI or CT scan. TREATMENT  Treatment is directed at the cause of the sciatic pain. Sometimes, treatment is not necessary and the pain and discomfort goes away on its own. If treatment  is needed, your caregiver may suggest:  Over-the-counter medicines to relieve pain.  Prescription medicines, such as anti-inflammatory medicine, muscle relaxants, or narcotics.  Applying heat or ice to the painful area.  Steroid injections to lessen pain, irritation, and inflammation around the nerve.  Reducing activity during periods of pain.  Exercising and stretching to strengthen your abdomen and improve flexibility of your spine. Your caregiver may suggest losing weight if the extra weight makes the back pain worse.  Physical therapy.  Surgery to eliminate what is pressing or pinching the nerve, such as a bone spur or part of a herniated disk. HOME CARE INSTRUCTIONS   Only take over-the-counter or prescription medicines for pain or discomfort as directed by your caregiver.  Apply ice to the affected area for 20 minutes, 3-4 times a day for the first 48-72 hours. Then try heat in the same way.  Exercise, stretch, or perform your usual activities if these do not aggravate your pain.  Attend physical therapy sessions as directed by your caregiver.  Keep all follow-up appointments as directed by your caregiver.  Do not wear high heels or shoes that do not provide proper support.  Check your mattress to see if it is too soft. A firm mattress may lessen your pain and discomfort. SEEK IMMEDIATE MEDICAL CARE IF:   You lose control of your bowel or bladder (incontinence).  You have increasing weakness in the lower back, pelvis, buttocks, or legs.  You have redness or swelling of your back.  You have a burning sensation when you urinate.  You have pain that gets worse when you lie down or awakens you at night.  Your pain is worse than you have experienced in the past.  Your pain is lasting longer than 4 weeks.  You are suddenly losing weight without reason. MAKE SURE YOU:  Understand these instructions.  Will watch your condition.  Will get help right away if you are  not doing well or get worse.   This information is not intended to replace advice given to you by your health care provider. Make sure you discuss any questions you have with your health care provider.   Document Released: 06/16/2001 Document Revised: 03/13/2015 Document Reviewed: 11/01/2011 Elsevier Interactive Patient Education [employer]

## 2015-05-03 NOTE — Progress Notes (Signed)
Subjective:    Patient ID: Javier Bridges, male    DOB: [birth year], 54 y.o.   MRN: [id number]  HPI Javier Bridges, a 54 year old patient with a history of osteoarthritis presents for follow up of low back pain with radiation to left leg. He states that back pain typically interferes with sleeping. Pain is described as constant, aching, and radiating. Current pain intensity is 6/10. He states that pain is worsened with increased activity. Patient last had gabapentin around 6 am with maximum relief. He denies fever, fatigue, incontinence of urine or stool.   Javier Bridges is complaining of tooth pain for 4 days. He states that is teeth and gums are sensitive to heat and cold. He reports that he has not been to the dentist in greater than 20 years.   Immunization History  Administered Date(s) Administered  . Pneumococcal Polysaccharide-23 05/03/2015   No past medical history on file.  Social History   Social History  . Marital Status: Single    Spouse Name: N/A  . Number of Children: N/A  . Years of Education: N/A   Occupational History  . Not on file.   Social History Main Topics  . Smoking status: Former Smoker -- 0.50 packs/day    Types: Cigarettes  . Smokeless tobacco: Never Used  . Alcohol Use: Yes     Comment: ocasionally  . Drug Use: Yes    Special: Marijuana  . Sexual Activity: No   Other Topics Concern  . Not on file   Social History Narrative   Review of Systems  Constitutional: Negative.   HENT: Negative.   Eyes: Negative.   Respiratory: Negative.   Cardiovascular: Negative.   Gastrointestinal: Negative.   Endocrine: Negative.   Genitourinary: Negative.   Musculoskeletal: Positive for myalgias, back pain and arthralgias.  Skin: Negative.   Allergic/Immunologic: Negative.   Neurological: Negative.   Hematological: Negative.   Psychiatric/Behavioral: Negative.        Objective:   Physical Exam  Constitutional: He is oriented to person, place, and  time. He appears well-developed and well-nourished.  HENT:  Head: Normocephalic and atraumatic.  Right Ear: External ear normal.  Left Ear: External ear normal.  Mouth/Throat: Oropharynx is clear and moist. Abnormal dentition. Dental caries (broken teeth; erythemia to gums) present.    Eyes: Conjunctivae and EOM are normal. Pupils are equal, round, and reactive to light.  Neck: Normal range of motion. Neck supple.  Cardiovascular: Normal rate, regular rhythm, normal heart sounds and intact distal pulses.   Pulmonary/Chest: Effort normal and breath sounds normal.  Abdominal: Soft. Bowel sounds are normal.  Musculoskeletal:       Lumbar back: He exhibits decreased range of motion and pain. He exhibits no tenderness, no swelling, no spasm and normal pulse.  Neurological: He is alert and oriented to person, place, and time. He has normal reflexes.  Skin: Skin is warm and dry.  Psychiatric: He has a normal mood and affect. His behavior is normal. Judgment and thought content normal.      BP 113/76 mmHg  Pulse 62  Temp(Src) 97.8 F (36.6 C) (Oral)  Resp 16  Ht  (1.753 m)  Wt 158 lb (71.668 kg)  BMI 23.32 kg/m2 Assessment & Plan:  1. Midline low back pain without sciatica Will start a trial of gabapentin. Patient may warrant a referral to a pain clinic for increased pain intensity. Will follow-up in 1 month for reassessment of back pain.  - gabapentin (  NEURONTIN) 300 MG capsule; Take 1 capsule (300 mg total) by mouth 3 (three) times daily.  Dispense: 90 capsule; Refill: 0  2. Pain due to dental caries I suspect that patient has a left abscess. Will start an antibiotic. Recommend Ibuprofen 600 mg every 8 hours for mild to moderate tooth pain. Called dental clinic that accepts patient's insurance. Patient will make an appointment.  - amoxicillin (AMOXIL) 500 MG capsule; Take 1 capsule (500 mg total) by mouth 2 (two) times daily.  Dispense: 20 capsule; Refill: 0  3. Immunization  due  - Pneumococcal polysaccharide vaccine 23-valent greater than or equal to 2yo subcutaneous/IM  4. Tobacco dependence Smoking cessation instruction/counseling given:  counseled patient on the dangers of tobacco use, advised patient to stop smoking, and reviewed strategies to maximize success  5. Routine health maintenance Patient advised to return for fasting lipid panel in 1 week. Patient stopped statin therapy. Recommend OTC Omega 3 fatty acid.  Discussed diet and exercise at length.    RTC: 1 month The patient was given clear instructions to go to ER or return to medical center if symptoms do not improve, worsen or new problems develop. The patient verbalized understanding. Will notify patient with laboratory results. Massie MaroonHollis,Shalia Bartko M, FNP

## 2015-06-04 ENCOUNTER — Ambulatory Visit (INDEPENDENT_AMBULATORY_CARE_PROVIDER_SITE_OTHER): Payer: Medicaid Other | Admitting: Family Medicine

## 2015-06-04 DIAGNOSIS — M545 Low back pain, unspecified: Secondary | ICD-10-CM

## 2015-06-04 DIAGNOSIS — E781 Pure hyperglyceridemia: Secondary | ICD-10-CM | POA: Diagnosis not present

## 2015-06-04 DIAGNOSIS — M5442 Lumbago with sciatica, left side: Secondary | ICD-10-CM | POA: Diagnosis not present

## 2015-06-04 DIAGNOSIS — F17200 Nicotine dependence, unspecified, uncomplicated: Secondary | ICD-10-CM

## 2015-06-04 MED ORDER — FENOFIBRATE 48 MG PO TABS
48.0000 mg | ORAL_TABLET | Freq: Every day | ORAL | Status: DC
Start: 2015-06-04 — End: 2015-07-09

## 2015-06-04 MED ORDER — FENOFIBRATE 48 MG PO TABS: 48.0000 mg | ORAL_TABLET | Freq: Every day | ORAL | Status: DC

## 2015-06-04 MED ORDER — GABAPENTIN 400 MG PO CAPS: 400.0000 mg | ORAL_CAPSULE | Freq: Four times a day (QID) | ORAL | Status: DC

## 2015-06-04 NOTE — Progress Notes (Signed)
Subjective:    Patient ID: Javier Bridges, male    DOB: 03-Mar-1961, 54 y.o.   MRN: [id number]  HPI Javier Bridges, a 54 year old patient with a history of osteoarthritis presents for follow up of low back pain with radiation to left leg. He states that back pain typically interferes with sleeping. Pain is described as constant, aching, and radiating. Current pain intensity is 6/10. He states that pain is worsened with increased activity. Patient last had gabapentin around 6 am with maximum relief. He denies fever, fatigue, incontinence of urine or stool.   Immunization History  Administered Date(s) Administered  . Pneumococcal Polysaccharide-23 05/03/2015   No past medical history on file.  Social History   Social History  . Marital Status: Single    Spouse Name: N/A  . Number of Children: N/A  . Years of Education: N/A   Occupational History  . Not on file.   Social History Main Topics  . Smoking status: Former Smoker -- 0.50 packs/day    Types: Cigarettes  . Smokeless tobacco: Never Used  . Alcohol Use: Yes     Comment: ocasionally  . Drug Use: Yes    Special: Marijuana  . Sexual Activity: No   Other Topics Concern  . Not on file   Social History Narrative   Review of Systems  Constitutional: Negative.   HENT: Negative.   Eyes: Negative.   Respiratory: Negative.   Cardiovascular: Negative.   Gastrointestinal: Negative.   Endocrine: Negative.   Genitourinary: Negative.   Musculoskeletal: Positive for myalgias, back pain and arthralgias.  Skin: Negative.   Allergic/Immunologic: Negative.   Neurological: Negative.   Hematological: Negative.   Psychiatric/Behavioral: Negative.        Objective:   Physical Exam  Constitutional: He is oriented to person, place, and time. He appears well-developed and well-nourished.  HENT:  Head: Normocephalic and atraumatic.  Right Ear: External ear normal.  Left Ear: External ear normal.  Mouth/Throat: Oropharynx is  clear and moist. Abnormal dentition (completely edentulous).  Eyes: Conjunctivae and EOM are normal. Pupils are equal, round, and reactive to light.  Neck: Normal range of motion. Neck supple.  Cardiovascular: Normal rate, regular rhythm, normal heart sounds and intact distal pulses.   Pulmonary/Chest: Effort normal and breath sounds normal.  Abdominal: Soft. Bowel sounds are normal.  Musculoskeletal:       Lumbar back: He exhibits decreased range of motion and pain. He exhibits no tenderness, no swelling, no spasm and normal pulse.  Neurological: He is alert and oriented to person, place, and time. He has normal reflexes.  Skin: Skin is warm and dry.  Psychiatric: He has a normal mood and affect. His behavior is normal. Judgment and thought content normal.       BP 121/78 mmHg  Pulse 46  Temp(Src) 97.7 F (36.5 C) (Oral)  Resp 14  Ht  (1.753 m)  Wt 159 lb (72.122 kg)  BMI 23.47 kg/m2 Assessment & Plan:  1. Low back pain with left-sided sciatica, unspecified back pain laterality Reviewed previous medical records. Patient does not have back x-rays. Patient's payer source has been approved. Patient has chronic back pain, will schedule a back xray.  - COMPLETE METABOLIC PANEL WITH GFR - POCT urinalysis dipstick - gabapentin (NEURONTIN) 400 MG capsule; Take 1 capsule (400 mg total) by mouth 4 (four) times daily.  Dispense: 120 capsule; Refill: 0 - DG Lumbar Spine Complete; Future   2. Tobacco dependence Smoking cessation instruction/counseling  given:  counseled patient on the dangers of tobacco use, advised patient to stop smoking, and reviewed strategies to maximize success  3. Hypertriglyceridemia - fenofibrate (TRICOR) 48 MG tablet; Take 1 tablet (48 mg total) by mouth daily.  Dispense: 30 tablet; Refill: 0 - COMPLETE METABOLIC PANEL WITH GFR - Lipid Panel  RTC: 1 month for low back pain The patient was given clear instructions to go to ER or return to medical center if  symptoms do not improve, worsen or new problems develop. The patient verbalized understanding. Will notify patient with laboratory results. Massie MaroonHollis,Hervey Wedig M, FNP

## 2015-06-04 NOTE — Patient Instructions (Signed)

## 2015-06-05 ENCOUNTER — Encounter: Payer: Self-pay | Admitting: Family Medicine

## 2015-06-05 LAB — COMPLETE METABOLIC PANEL WITH GFR
ALBUMIN: 4 g/dL (ref 3.6–5.1)
ALT: 31 U/L (ref 9–46)
AST: 39 U/L — AB (ref 10–35)
Alkaline Phosphatase: 60 U/L (ref 40–115)
BILIRUBIN TOTAL: 0.4 mg/dL (ref 0.2–1.2)
BUN: 16 mg/dL (ref 7–25)
CO2: 27 mmol/L (ref 20–31)
CREATININE: 1.54 mg/dL — AB (ref 0.70–1.33)
Calcium: 9.2 mg/dL (ref 8.6–10.3)
Chloride: 99 mmol/L (ref 98–110)
GFR, EST NON AFRICAN AMERICAN: 50 mL/min — AB (ref >=60)
GFR, Est African American: 57 mL/min — ABNORMAL LOW (ref >=60)
GLUCOSE: 76 mg/dL (ref 65–99)
Potassium: 4.1 mmol/L (ref 3.5–5.3)
SODIUM: 135 mmol/L (ref 135–146)
TOTAL PROTEIN: 7.8 g/dL (ref 6.1–8.1)

## 2015-06-05 LAB — LIPID PANEL
Cholesterol: 379 mg/dL — ABNORMAL HIGH (ref 125–200)
HDL: 37 mg/dL — AB (ref >=40)
LDL CALC: 295 mg/dL — AB (ref <130)
Total CHOL/HDL Ratio: 10.2 Ratio — ABNORMAL HIGH (ref <=5.0)
Triglycerides: 233 mg/dL — ABNORMAL HIGH (ref <150)
VLDL: 47 mg/dL — ABNORMAL HIGH (ref <30)

## 2015-07-09 ENCOUNTER — Ambulatory Visit (INDEPENDENT_AMBULATORY_CARE_PROVIDER_SITE_OTHER): Payer: Medicaid Other | Admitting: Family Medicine

## 2015-07-09 ENCOUNTER — Encounter: Payer: Self-pay | Admitting: Family Medicine

## 2015-07-09 VITALS — BP 126/94 | HR 50 | Temp 97.6°F | Resp 14 | Ht 69.0 in | Wt 162.0 lb

## 2015-07-09 DIAGNOSIS — F17200 Nicotine dependence, unspecified, uncomplicated: Secondary | ICD-10-CM | POA: Diagnosis not present

## 2015-07-09 DIAGNOSIS — M5442 Lumbago with sciatica, left side: Secondary | ICD-10-CM

## 2015-07-09 DIAGNOSIS — E781 Pure hyperglyceridemia: Secondary | ICD-10-CM | POA: Diagnosis not present

## 2015-07-09 MED ORDER — KETOROLAC TROMETHAMINE 60 MG/2ML IM SOLN
60.0000 mg | Freq: Once | INTRAMUSCULAR | Status: AC
  Administered 2015-07-09: 60 mg via INTRAMUSCULAR

## 2015-07-09 MED ORDER — GABAPENTIN 400 MG PO CAPS: 400.0000 mg | ORAL_CAPSULE | Freq: Four times a day (QID) | ORAL | Status: DC

## 2015-07-09 MED ORDER — ASPIRIN EC 81 MG PO TBEC: 81.0000 mg | DELAYED_RELEASE_TABLET | Freq: Every day | ORAL | Status: DC

## 2015-07-09 MED ORDER — FENOFIBRATE 48 MG PO TABS
48.0000 mg | ORAL_TABLET | Freq: Every day | ORAL | Status: DC
Start: 2015-07-09 — End: 2016-09-30

## 2015-07-09 NOTE — Patient Instructions (Signed)

## 2015-07-09 NOTE — Progress Notes (Signed)
Subjective:    Patient ID: Bjorn PippinRandy L Chuck, male    DOB: [birth year], 55 y.o.   MRN: [id number]  HPI Mr. Asencion GowdaRandy Mcavoy, a 55 year old patient with a history of osteoarthritis presents for follow up of low back pain with radiation to left leg. He states that back pain typically interferes with sleeping and walking long distances. He was previously a patient at [facility], he states that he has not had xrays done. He was scheduled for x-rays 1 month ago and did not report duet to transportation constraints. . Pain is described as constant, aching, and radiating to left leg.  Current pain intensity is 7/10. He states that pain is worsened with increased activity. Patient last had gabapentin on yesterday, he did not stay at home on last night. He states that it has been helping with sciatica moderately. He denies fever, fatigue, incontinence of urine or stool.   Immunization History  Administered Date(s) Administered  . Pneumococcal Polysaccharide-23 05/03/2015   No past medical history on file.  Social History   Social History  . Marital Status: Single    Spouse Name: N/A  . Number of Children: N/A  . Years of Education: N/A   Occupational History  . Not on file.   Social History Main Topics  . Smoking status: Former Smoker -- 0.50 packs/day    Types: Cigarettes  . Smokeless tobacco: Never Used  . Alcohol Use: Yes     Comment: ocasionally  . Drug Use: Yes    Special: Marijuana  . Sexual Activity: No   Other Topics Concern  . Not on file   Social History Narrative   Review of Systems  Constitutional: Negative.   HENT: Negative.   Eyes: Negative.   Respiratory: Negative.   Cardiovascular: Negative.   Gastrointestinal: Negative.   Endocrine: Negative.   Genitourinary: Negative.   Musculoskeletal: Positive for myalgias, back pain and arthralgias.  Skin: Negative.   Allergic/Immunologic: Negative.   Neurological: Negative.   Hematological: Negative.    Psychiatric/Behavioral: Negative.        Objective:   Physical Exam  Constitutional: He is oriented to person, place, and time. He appears well-developed and well-nourished.  HENT:  Head: Normocephalic and atraumatic.  Right Ear: External ear normal.  Left Ear: External ear normal.  Mouth/Throat: Oropharynx is clear and moist. Abnormal dentition (completely edentulous).  Eyes: Conjunctivae and EOM are normal. Pupils are equal, round, and reactive to light.  Neck: Normal range of motion. Neck supple.  Cardiovascular: Normal rate, regular rhythm, normal heart sounds and intact distal pulses.   Pulmonary/Chest: Effort normal and breath sounds normal.  Abdominal: Soft. Bowel sounds are normal.  Musculoskeletal:       Lumbar back: He exhibits decreased range of motion and pain. He exhibits no tenderness, no swelling, no spasm and normal pulse.  Neurological: He is alert and oriented to person, place, and time. He has normal reflexes.  Skin: Skin is warm and dry.  Psychiatric: He has a normal mood and affect. His behavior is normal. Judgment and thought content normal.       BP 126/94 mmHg  Pulse 50  Temp(Src) 97.6 F (36.4 C) (Oral)  Resp 14  Ht 5\' 9"  (1.753 m)  Wt 162 lb (73.483 kg)  BMI 23.91 kg/m2 Assessment & Plan:   1. Low back pain with left-sided sciatica, unspecified back pain laterality Reviewed previous medical records. Patient does not have back x-rays. Patient's payer source has been approved.  Patient has chronic back pain, scheduled a back xray. Patient did not report for previous back xrays due to transportation constraints.  Recommend that he follow up at [facility] for [facility]rays. Patient to follow up in office after xrays.  - gabapentin (NEURONTIN) 400 MG capsule; Take 1 capsule (400 mg total) by mouth 4 (four) times daily.  Dispense: 120 capsule; Refill: 2 - ketorolac (TORADOL) injection 60 mg; Inject 2 mLs (60 mg total) into the muscle once.  2. Hypertriglyceridemia ASCVD  Risk factor is 22.1%, previously failed high intensity statin therapy. Will continue Tricor, patient will follow up for fasting cholesterol in 6 month. Will also start Aspirin therapy 81 mg daily.  - fenofibrate (TRICOR) 48 MG tablet; Take 1 tablet (48 mg total) by mouth daily.  Dispense: 90 tablet; Refill: 1 - aspirin EC 81 MG tablet; Take 1 tablet (81 mg total) by mouth daily.  Dispense: 30 tablet; Refill: 5 3. Tobacco dependence Smoking cessation instruction/counseling given:  counseled patient on the dangers of tobacco use, advised patient to stop smoking, and reviewed strategies to maximize success   RTC: Follow up in 6 months for hypertriglyceridemia, and schedule appointment after xrays are completed.   The patient was given clear instructions to go to ER or return to medical center if symptoms do not improve, worsen or new problems develop. The patient verbalized understanding.      Massie Maroon, FNP

## 2016-01-03 ENCOUNTER — Ambulatory Visit: Payer: Medicaid Other | Admitting: Family Medicine

## 2016-01-08 ENCOUNTER — Ambulatory Visit: Payer: Medicaid Other | Admitting: Family Medicine

## 2016-03-12 ENCOUNTER — Other Ambulatory Visit: Payer: Self-pay | Admitting: Family Medicine

## 2016-03-12 DIAGNOSIS — M5442 Lumbago with sciatica, left side: Secondary | ICD-10-CM

## 2016-09-30 ENCOUNTER — Ambulatory Visit (HOSPITAL_COMMUNITY)
Admission: RE | Admit: 2016-09-30 | Discharge: 2016-09-30 | Disposition: A | Discharge status: Home / Self Care | Payer: Medicaid Other | Source: Ambulatory Visit | Attending: Family Medicine | Admitting: Family Medicine

## 2016-09-30 ENCOUNTER — Ambulatory Visit (INDEPENDENT_AMBULATORY_CARE_PROVIDER_SITE_OTHER): Payer: Medicaid Other | Admitting: Family Medicine

## 2016-09-30 ENCOUNTER — Encounter: Payer: Self-pay | Admitting: Family Medicine

## 2016-09-30 VITALS — BP 110/68 | HR 50 | Temp 98.7°F | Ht 69.0 in | Wt 160.8 lb

## 2016-09-30 DIAGNOSIS — R001 Bradycardia, unspecified: Secondary | ICD-10-CM

## 2016-09-30 DIAGNOSIS — Z23 Encounter for immunization: Secondary | ICD-10-CM

## 2016-09-30 DIAGNOSIS — R011 Cardiac murmur, unspecified: Secondary | ICD-10-CM | POA: Diagnosis not present

## 2016-09-30 DIAGNOSIS — Z114 Encounter for screening for human immunodeficiency virus [HIV]: Secondary | ICD-10-CM

## 2016-09-30 DIAGNOSIS — E781 Pure hyperglyceridemia: Secondary | ICD-10-CM

## 2016-09-30 DIAGNOSIS — Z1159 Encounter for screening for other viral diseases: Secondary | ICD-10-CM | POA: Diagnosis not present

## 2016-09-30 DIAGNOSIS — D649 Anemia, unspecified: Secondary | ICD-10-CM

## 2016-09-30 DIAGNOSIS — J9811 Atelectasis: Secondary | ICD-10-CM | POA: Insufficient documentation

## 2016-09-30 DIAGNOSIS — G8929 Other chronic pain: Secondary | ICD-10-CM

## 2016-09-30 DIAGNOSIS — G629 Polyneuropathy, unspecified: Secondary | ICD-10-CM | POA: Diagnosis not present

## 2016-09-30 DIAGNOSIS — R079 Chest pain, unspecified: Secondary | ICD-10-CM

## 2016-09-30 DIAGNOSIS — R768 Other specified abnormal immunological findings in serum: Secondary | ICD-10-CM

## 2016-09-30 DIAGNOSIS — M5442 Lumbago with sciatica, left side: Secondary | ICD-10-CM

## 2016-09-30 LAB — CBC WITH DIFFERENTIAL/PLATELET
BASOS ABS: 110 {cells}/uL (ref 0–200)
Basophils Relative: 1 %
EOS ABS: 770 {cells}/uL — AB (ref 15–500)
Eosinophils Relative: 7 %
HCT: 39.4 % (ref 38.5–50.0)
Hemoglobin: 13.4 g/dL (ref 13.2–17.1)
LYMPHS PCT: 31 %
Lymphs Abs: 3410 cells/uL (ref 850–3900)
MCH: 30.9 pg (ref 27.0–33.0)
MCHC: 34 g/dL (ref 32.0–36.0)
MCV: 91 fL (ref 80.0–100.0)
MONOS PCT: 7 %
MPV: 9.8 fL (ref 7.5–12.5)
Monocytes Absolute: 770 cells/uL (ref 200–950)
NEUTROS ABS: 5940 {cells}/uL (ref 1500–7800)
NEUTROS PCT: 54 %
PLATELETS: 265 10*3/uL (ref 140–400)
RBC: 4.33 MIL/uL (ref 4.20–5.80)
RDW: 15.8 % — ABNORMAL HIGH (ref 11.0–15.0)
WBC: 11 10*3/uL — ABNORMAL HIGH (ref 3.8–10.8)

## 2016-09-30 LAB — POCT GLYCOSYLATED HEMOGLOBIN (HGB A1C): HEMOGLOBIN A1C: 5.9

## 2016-09-30 LAB — LIPID PANEL
CHOLESTEROL: 388 mg/dL — AB (ref <200)
HDL: 44 mg/dL (ref >40)
LDL Cholesterol: 310 mg/dL — ABNORMAL HIGH (ref <100)
Total CHOL/HDL Ratio: 8.8 Ratio — ABNORMAL HIGH (ref <5.0)
Triglycerides: 168 mg/dL — ABNORMAL HIGH (ref <150)
VLDL: 34 mg/dL — AB (ref <30)

## 2016-09-30 NOTE — Patient Instructions (Signed)
Continue Gabapentin for neuropathy Will follow up by phone with laboratory results Dyslipidemia Dyslipidemia is an imbalance of waxy, fat-like substances (lipids) in the blood. The body needs lipids in small amounts. Dyslipidemia often involves a high level of cholesterol or triglycerides, which are types of lipids. Common forms of dyslipidemia include:  High levels of bad cholesterol (LDL cholesterol). LDL is the type of cholesterol that causes fatty deposits (plaques) to build up in the blood vessels that carry blood away from your heart (arteries).  Low levels of good cholesterol (HDL cholesterol). HDL cholesterol is the type of cholesterol that protects against heart disease. High levels of HDL remove the LDL buildup from arteries.  High levels of triglycerides. Triglycerides are a fatty substance in the blood that is linked to a buildup of plaques in the arteries. You can develop dyslipidemia because of the genes you are born with (primary dyslipidemia) or changes that occur during your life (secondary dyslipidemia), or as a side effect of certain medical treatments. What are the causes? Primary dyslipidemia is caused by changes (mutations) in genes that are passed down through families (inherited). These mutations cause several types of dyslipidemia. Mutations can result in disorders that make the body produce too much LDL cholesterol or triglycerides, or not enough HDL cholesterol. These disorders may lead to heart disease, arterial disease, or stroke at an early age. Causes of secondary dyslipidemia include certain lifestyle choices and diseases that lead to dyslipidemia, such as:  Eating a diet that is high in animal fat.  Not getting enough activity or exercise (having a sedentary lifestyle).  Having diabetes, kidney disease, liver disease, or thyroid disease.  Drinking large amounts of alcohol.  Using certain types of drugs. What increases the risk? You may be at greater risk for  dyslipidemia if you are an older man or if you are a woman who has gone through menopause. Other risk factors include:  Having a family history of dyslipidemia.  Taking certain medicines, including birth control pills, steroids, some diuretics, beta-blockers, and some medicines forHIV.  Smoking cigarettes.  Eating a high-fat diet.  Drinking large amounts of alcohol.  Having certain medical conditions such as diabetes, polycystic ovary syndrome (PCOS), pregnancy, kidney disease, liver disease, or hypothyroidism.  Not exercising regularly.  Being overweight or obese with too much belly fat. What are the signs or symptoms? Dyslipidemia does not usually cause any symptoms. Very high lipid levels can cause fatty bumps under the skin (xanthomas) or a white or gray ring around the black center (pupil) of the eye. Very high triglyceride levels can cause inflammation of the pancreas (pancreatitis). How is this diagnosed? Your health care provider may diagnose dyslipidemia based on a routine blood test (fasting blood test). Because most people do not have symptoms of the condition, this blood testing (lipid profile) is done on adults age 71 and older and is repeated every 5 years. This test checks:  Total cholesterol. This is a measure of the total amount of cholesterol in your blood, including LDL cholesterol, HDL cholesterol, and triglycerides. A healthy number is below 200.  LDL cholesterol. The target number for LDL cholesterol is different for each person, depending on individual risk factors. For most people, a number below 100 is healthy. Ask your health care provider what your LDL cholesterol number should be.  HDL cholesterol. An HDL level of 60 or higher is best because it helps to protect against heart disease. A number below 40 for men or below 50 for women  increases the risk for heart disease.  Triglycerides. A healthy triglyceride number is below 150. If your lipid profile is  abnormal, your health care provider may do other blood tests to get more information about your condition. How is this treated? Treatment depends on the type of dyslipidemia that you have and your other risk factors for heart disease and stroke. Your health care provider will have a target range for your lipid levels based on this information. For many people, treatment starts with lifestyle changes, such as diet and exercise. Your health care provider may recommend that you:  Get regular exercise.  Make changes to your diet.  Quit smoking if you smoke. If diet changes and exercise do not help you reach your goals, your health care provider may also prescribe medicine to lower lipids. The most commonly prescribed type of medicine lowers your LDL cholesterol (statin drug). If you have a high triglyceride level, your provider may prescribe another type of drug (fibrate) or an omega-3 fish oil supplement, or both. Follow these instructions at home:  Take over-the-counter and prescription medicines only as told by your health care provider. This includes supplements.  Get regular exercise. Start an aerobic exercise and strength training program as told by your health care provider. Ask your health care provider what activities are safe for you. Your health care provider may recommend:  30 minutes of aerobic activity 4-6 days a week. Brisk walking is an example of aerobic activity.  Strength training 2 days a week.  Eat a healthy diet as told by your health care provider. This can help you reach and maintain a healthy weight, lower your LDL cholesterol, and raise your HDL cholesterol. It may help to work with a diet and nutrition specialist (dietitian) to make a plan that is right for you. Your dietitian or health care provider may recommend:  Limiting your calories, if you are overweight.  Eating more fruits, vegetables, whole grains, fish, and lean meats.  Limiting saturated fat, trans fat,  and cholesterol.  Follow instructions from your health care provider or dietitian about eating or drinking restrictions.  Limit alcohol intake to no more than one drink per day for nonpregnant women and two drinks per day for men. One drink equals 12 oz of beer, 5 oz of wine, or 1 oz of hard liquor.  Do not use any products that contain nicotine or tobacco, such as cigarettes and e-cigarettes. If you need help quitting, ask your health care provider.  Keep all follow-up visits as told by your health care provider. This is important. Contact a health care provider if:  You are having trouble sticking to your exercise or diet plan.  You are struggling to quit smoking or control your use of alcohol. Summary  Dyslipidemia is an imbalance of waxy, fat-like substances (lipids) in the blood. The body needs lipids in small amounts. Dyslipidemia often involves a high level of cholesterol or triglycerides, which are types of lipids.  Treatment depends on the type of dyslipidemia that you have and your other risk factors for heart disease and stroke.  For many people, treatment starts with lifestyle changes, such as diet and exercise. Your health care provider may also prescribe medicine to lower lipids. This information is not intended to replace advice given to you by your health care provider. Make sure you discuss any questions you have with your health care provider. Document Released: 06/27/2013 Document Revised: 02/17/2016 Document Reviewed: 02/17/2016 Elsevier Interactive Patient Education  2017 Elsevier  Inc.  Chronic Back Pain When back pain lasts longer than 3 months, it is called chronic back pain.The cause of your back pain may not be known. Some common causes include:  Wear and tear (degenerative disease) of the bones, ligaments, or disks in your back.  Inflammation and stiffness in your back (arthritis). People who have chronic back pain often go through certain periods in which  the pain is more intense (flare-ups). Many people can learn to manage the pain with home care. Follow these instructions at home: Pay attention to any changes in your symptoms. Take these actions to help with your pain: Activity   Avoid bending and activities that make the problem worse.  Do not sit or stand in one place for long periods of time.  Take brief periods of rest throughout the day. This will reduce your pain. Resting in a lying or standing position is usually better than sitting to rest.  When you are resting for longer periods, mix in some mild activity or stretching between periods of rest. This will help to prevent stiffness and pain.  Get regular exercise. Ask your health care provider what activities are safe for you.  Do not lift anything that is heavier than 10 lb (4.5 kg). Always use proper lifting technique, which includes:  Bending your knees.  Keeping the load close to your body.  Avoiding twisting. Managing pain   If directed, apply ice to the painful area. Your health care provider may recommend applying ice during the first 24-48 hours after a flare-up begins.  Put ice in a plastic bag.  Place a towel between your skin and the bag.  Leave the ice on for 20 minutes, 2-3 times per day.  After icing, apply heat to the affected area as often as told by your health care provider. Use the heat source that your health care provider recommends, such as a moist heat pack or a heating pad.  Place a towel between your skin and the heat source.  Leave the heat on for 20-30 minutes.  Remove the heat if your skin turns bright red. This is especially important if you are unable to feel pain, heat, or cold. You may have a greater risk of getting burned.  Try soaking in a warm tub.  Take over-the-counter and prescription medicines only as told by your health care provider.  Keep all follow-up visits as told by your health care provider. This is important. Contact  a health care provider if:  You have pain that is not relieved with rest or medicine. Get help right away if:  You have weakness or numbness in one or both of your legs or feet.  You have trouble controlling your bladder or your bowels.  You have nausea or vomiting.  You have pain in your abdomen.  You have shortness of breath or you faint. This information is not intended to replace advice given to you by your health care provider. Make sure you discuss any questions you have with your health care provider. Document Released: 07/30/2004 Document Revised: 10/31/2015 Document Reviewed: 12/10/2014 Elsevier Interactive Patient Education  2017 [employer]

## 2016-09-30 NOTE — Progress Notes (Signed)
Subjective:    Patient ID: Javier Bridges, male    DOB: Jul 31, 1960, 56 y.o.   MRN: [id number]  HPI Javier Bridges, a 56 year old patient with a history of osteoarthritis and chronic back pain presents for follow up of low back pain with radiation to left leg. Patient has been lost to follow up for greater than 1 year. He says that back pain has been controlled on gabapentin.  He was previously scheduled for x-rays 1 month ago and did not report due to transportation constraints. Pain is described as intermittent, aching, and radiating to left leg.  Current pain intensity is 3/10. He states that pain is worsened with increased activity.  He denies fever, fatigue, incontinence of urine or stool.   Patient also complains of periodic shortness of breath and heart palpitations.  Symptoms include constant cough and dyspnea on exertion. Symptoms began several months ago, and course has been stable  since that time.  Patient denies cough after eating, difficulty breathing, tightness in chest, unresolving pneumonia and wheezing. Associated symptoms include weight loss. Patient has not had recent travel.    Appetite has been decreased. Symptoms are exacerbated by climbing stairs, minimal activity and walking. Patient also has a 30 year smoking history.  Immunization History  Administered Date(s) Administered  . Pneumococcal Polysaccharide-23 05/03/2015   No past medical history on file.  Social History   Social History  . Marital status: Single    Spouse name: N/A  . Number of children: N/A  . Years of education: N/A   Occupational History  . Not on file.   Social History Main Topics  . Smoking status: Former Smoker    Packs/day: 0.50    Types: Cigarettes  . Smokeless tobacco: Never Used  . Alcohol use Yes     Comment: ocasionally  . Drug use: Yes    Types: Marijuana  . Sexual activity: No   Other Topics Concern  . Not on file   Social History Narrative  . No narrative on file    Review of Systems  Constitutional: Positive for fatigue and unexpected weight change (weight loss).  HENT: Negative.   Eyes: Negative.   Respiratory: Positive for shortness of breath.   Cardiovascular: Positive for palpitations.  Gastrointestinal: Negative.   Endocrine: Negative.   Genitourinary: Negative.   Musculoskeletal: Positive for back pain.  Skin: Negative.   Allergic/Immunologic: Negative.   Neurological: Positive for numbness (left leg).  Hematological: Negative.   Psychiatric/Behavioral: Negative.        Objective:   Physical Exam  Constitutional: He is oriented to person, place, and time. He appears well-developed and well-nourished.  HENT:  Head: Normocephalic and atraumatic.  Right Ear: External ear normal.  Left Ear: External ear normal.  Mouth/Throat: Oropharynx is clear and moist. Abnormal dentition (completely edentulous).  Eyes: Conjunctivae and EOM are normal. Pupils are equal, round, and reactive to light.  Neck: Normal range of motion. Neck supple.  Cardiovascular: Normal heart sounds and intact distal pulses.  An irregular rhythm present. Bradycardia present.   Pulses:      Dorsalis pedis pulses are 1+ on the right side, and 1+ on the left side.  Pulmonary/Chest: Effort normal. He has decreased breath sounds in the right upper field, the right middle field, the right lower field, the left upper field, the left middle field and the left lower field.  Abdominal: Soft. Bowel sounds are normal.  Musculoskeletal:       Lumbar back:  He exhibits decreased range of motion and pain. He exhibits no tenderness, no swelling, no spasm and normal pulse.  Neurological: He is alert and oriented to person, place, and time. He has normal reflexes.  Skin: Skin is warm and dry.  Psychiatric: He has a normal mood and affect. His behavior is normal. Judgment and thought content normal.       BP 110/68 (BP Location: Right Arm, Patient Position: Sitting, Cuff Size: Small)    Pulse (!) 50   Temp 98.7 F (37.1 C) (Oral)   Ht 5\' 9"  (1.753 m)   Wt 160 lb 12.8 oz (72.9 kg)   SpO2 100%   BMI 23.75 kg/m  Assessment & Plan:  1. Chronic left-sided low back pain with left-sided sciatica Back pain is controlled on gabapentin, will continue as previously prescribed.  - gabapentin (NEURONTIN) 400 MG capsule; Take 1 capsule (400 mg total) by mouth 3 (three) times daily.  Dispense: 90 capsule; Refill: 5  2. Neuropathy (HCC) - HgB A1c - gabapentin (NEURONTIN) 400 MG capsule; Take 1 capsule (400 mg total) by mouth 3 (three) times daily.  Dispense: 90 capsule; Refill: 5  3. Hypertriglyceridemia ASCVD is 22.1%, patient has failed statin therapy and fenofibrate. The patient is asked to make an attempt to improve diet and exercise patterns to aid in medical management of this problem. - Lipid Panel  4. Need for Tdap vaccination - Tdap vaccine greater than or equal to 7yo IM  5. Need for hepatitis C screening test - Hepatitis C Antibody  6. Screening for HIV (human immunodeficiency virus) - HIV antibody (with reflex)  7. Anemia, unspecified type - CBC with Differential  8. Bradycardia Marked bradycardia on EKG. Will send a cardiology referral. The patient was given clear instructions to go to ER or return to medical center if symptoms do not improve, worsen or new problems develop. The patient verbalized understanding. Will notify patient with laboratory results. - EKG 12-Lead - Ambulatory referral to Cardiology - DG Chest 2 View; Future  9. Chest pain, unspecified type - DG Chest 2 View; Future  10. Positive hepatitis C antibody test  - Hepatitis C RNA quantitative   RTC: 1 month for chronic conditions   Nolon NationsLaChina Moore Hollis  MSN, FNP-C [facility] [address]  [place], [place] [id number] [phone]

## 2016-10-01 ENCOUNTER — Telehealth: Payer: Self-pay

## 2016-10-01 ENCOUNTER — Telehealth: Payer: Self-pay | Admitting: Family Medicine

## 2016-10-01 LAB — HEPATITIS C ANTIBODY: HCV AB: REACTIVE — AB

## 2016-10-01 LAB — HIV ANTIBODY (ROUTINE TESTING W REFLEX): HIV: NONREACTIVE

## 2016-10-01 NOTE — Telephone Encounter (Signed)
Attempted to call patient to discuss chest xray results. No answer and could not leave message. Will attempt to call later.    Javier NationsLaChina Bridges Javier Deshotel  MSN, FNP-C [facility] [address]  [place], [place] [address] [phone]

## 2016-10-02 LAB — HEPATITIS C RNA QUANTITATIVE
HCV QUANT LOG: 5.99 {Log_IU}/mL — AB
HCV Quantitative: 987000 IU/mL — ABNORMAL HIGH

## 2016-10-02 MED ORDER — GABAPENTIN 400 MG PO CAPS: 400.0000 mg | ORAL_CAPSULE | Freq: Three times a day (TID) | ORAL | 5 refills | Status: DC

## 2016-10-05 ENCOUNTER — Other Ambulatory Visit: Payer: Self-pay | Admitting: Family Medicine

## 2016-10-05 DIAGNOSIS — B1920 Unspecified viral hepatitis C without hepatic coma: Secondary | ICD-10-CM

## 2016-10-05 NOTE — Progress Notes (Signed)
Reviewed labs, positive for hepatitis C with positive confirmation. Discussed diagnosis at length, will send a referral to infectious disease.   Nolon Nations  MSN, FNP-C [facility] [address] [place], [place] [address] [phone]

## 2016-10-05 NOTE — Telephone Encounter (Signed)
Reviewed labs, called patient to discuss test results. Left a message with patient's mother to return call.   Nolon Nations  MSN, FNP-C [facility] [address] [place], [place] [address] [phone]

## 2016-10-20 ENCOUNTER — Encounter: Payer: Self-pay | Admitting: Cardiovascular Disease

## 2016-11-04 NOTE — Progress Notes (Signed)
Cardiology Office Note   Date:  11/06/2016   ID:  Javier Bridges, DOB [birth year], MRN [id number]  PCP:  Massie Maroon, FNP  Cardiologist:   Charlton Haws, MD   Chief Complaint  Patient presents with  . Establish Care      History of Present Illness: Javier Bridges is a 56 y.o. male who presents for evaluation/consultation bradycardia. Referred by [facility] Dr Hart [place].  Chronic back pain on gabapentin. Reviewed her office not 09/30/16 and patient complained about periodic dyspnea and heart palpitations. Several months of cough some weight loss Smoker over 30 pack years Still using mariajuana.  CXR done 09/30/16 reviewed and showed mild lingular subsegmental atelectasis K/ TSH not checked no history of syncope renal failure or CHF.    He smokes a pack every two days and occasional mariajuana Minimal ETOH On disability for scoliosis and back pain Neurontin helps. 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 No pertinent past medical history.  Past Surgical History:  Procedure Laterality Date  . Feet Surgery  1970     Current Outpatient Prescriptions  Medication Sig Dispense Refill  . aspirin 500 MG tablet Take 500 mg by mouth daily.    [name] gabapentin (NEURONTIN) 400 MG capsule Take 1 capsule (400 mg total) by mouth 3 (three) times daily. 90 capsule 5   No current facility-administered medications for this visit.     Allergies:   Tricor [fenofibrate]    Social History:  The patient  reports that he has been smoking Cigarettes.  He has been smoking about 0.50 packs per day. He has never used smokeless tobacco. He reports that he drinks alcohol. He reports that he uses drugs, including Marijuana. Does not work on disability for back   Family History:  The patient's family history includes Cancer in his father; Hypertension in his mother.  3 brothers fine No body has pacer or syncope    ROS:  Please see the history of present illness.    Otherwise, review of systems are positive for none.   All other systems are reviewed and negative.    PHYSICAL EXAM: VS:  BP 110/80   Pulse (!) 53   Ht  (1.753 m)   Wt 155 lb 12.8 oz (70.7 kg)   SpO2 98%   BMI 23.01 kg/m  , BMI Body mass index is 23.01 kg/m. Affect appropriate Looks older than stated age  HEENT: normal Neck supple with no adenopathy JVP normal right bruits no thyromegaly Lungs clear with no wheezing and good diaphragmatic motion Heart:  S1/S2 no murmur, no rub, gallop or click PMI normal Abdomen: benighn, BS positve, no tenderness, no AAA no bruit.  No HSM or HJR Distal pulses intact with no bruits No edema Neuro non-focal Skin warm and dry multiple tattoos  No muscular weakness    EKG: SB rate 41 otherwise normal 10/06/16    Recent Labs: 09/30/2016: Hemoglobin 13.4; Platelets 265    Lipid Panel    Component Value Date/Time   CHOL 388 (H) 09/30/2016 1154   TRIG 168 (H) 09/30/2016 1154   HDL 44 09/30/2016 1154   CHOLHDL 8.8 (H) 09/30/2016 1154   VLDL 34 (H) 09/30/2016 1154   LDLCALC 310 (H) 09/30/2016 1154      Wt Readings from Last 3 Encounters:  11/06/16 155 lb 12.8 oz (70.7 kg)  09/30/16 160 lb 12.8 oz (72.9 kg)  07/09/15 162 lb (73.5 kg)  Other studies Reviewed: Additional studies/ records that were reviewed today include: Notes [facility] labs , ECG and CXR .    ASSESSMENT AND PLAN:  1.  Bradycardia benign asymptomatic check K/Cr TSH ETT to assess for chronotropic incompetence and see how High he can get his HR with activity  2. Cholesterol diet Rx labs with primary  3. Dyspnea related to smoking some atelectasis on CXR f/u primary counseled on cessation less than 10 minutes 4. Bruit:  Right carotid f/u duplex ASA  5. Scoliosis:  Chronic back pain continue Neurontin    Current medicines are reviewed at length with the patient today.  The patient does not have concerns regarding medicines.  The following changes  have been made:  no change  Labs/ tests ordered today include: Echo, ETT labs including TSH, BMET LFTls   Orders Placed This Encounter  Procedures  . Basic metabolic panel  . T4, free  . TSH  . EXERCISE TOLERANCE TEST     Disposition:   FU with cardiology PRN      Signed, Charlton Haws, MD  11/06/2016 8:59 AM    [facility] [address] [place], [place], [place]  [address] Phone: [phone]; Fax: [phone]

## 2016-11-06 ENCOUNTER — Encounter: Payer: Self-pay | Admitting: Cardiovascular Disease

## 2016-11-06 ENCOUNTER — Ambulatory Visit (INDEPENDENT_AMBULATORY_CARE_PROVIDER_SITE_OTHER): Payer: Medicaid Other | Admitting: Cardiovascular Disease

## 2016-11-06 VITALS — BP 110/80 | HR 53 | Ht 69.0 in | Wt 155.8 lb

## 2016-11-06 DIAGNOSIS — R0989 Other specified symptoms and signs involving the circulatory and respiratory systems: Secondary | ICD-10-CM

## 2016-11-06 DIAGNOSIS — R001 Bradycardia, unspecified: Secondary | ICD-10-CM | POA: Diagnosis not present

## 2016-11-06 DIAGNOSIS — Z7689 Persons encountering health services in other specified circumstances: Secondary | ICD-10-CM | POA: Diagnosis not present

## 2016-11-06 NOTE — Patient Instructions (Addendum)
Medication Instructions:  Your physician recommends that you continue on your current medications as directed. Please refer to the Current Medication list given to you today.  Labwork: Your physician recommends that you have lab work today- BMET, TSH, and T4  Testing/Procedures: Your physician has requested that you have a carotid duplex. This test is an ultrasound of the carotid arteries in your neck. It looks at blood flow through these arteries that supply the brain with blood. Allow one hour for this exam. There are no restrictions or special instructions.  Your physician has requested that you have an exercise tolerance test. For further information please visit [email or web address]. Please also follow instruction sheet, as given.  Follow-Up: Your physician wants you to follow-up as needed with Dr. Eden EmmsNishan.    If you need a refill on your cardiac medications before your next appointment, please call your pharmacy.

## 2016-11-06 NOTE — Addendum Note (Signed)
Addended by: Virl AxePATE INGALLS, Jamaya Sleeth L on: 11/06/2016 11:43 AM   Modules accepted: Orders

## 2016-11-16 ENCOUNTER — Encounter: Payer: Self-pay | Admitting: Internal Medicine

## 2016-11-16 ENCOUNTER — Ambulatory Visit (INDEPENDENT_AMBULATORY_CARE_PROVIDER_SITE_OTHER): Payer: Medicaid Other | Admitting: Internal Medicine

## 2016-11-16 DIAGNOSIS — B182 Chronic viral hepatitis C: Secondary | ICD-10-CM | POA: Diagnosis present

## 2016-11-16 NOTE — Progress Notes (Signed)
[facility]   CC: consideration for treatment for chronic hepatitis C  HPI:  +Javier Bridges is a 56 y.o. male who presents for initial evaluation and management of chronic hepatitis C.  Patient tested positive last month. Hepatitis C-associated risk factors present are: tattoos (details: done by his brother and no shared needles). Patient denies intranasal drug use, IV drug abuse, renal dialysis. Patient has had other studies performed. Results: hepatitis C RNA by PCR, result: positive. Patient has not had prior treatment for Hepatitis C. Patient does not have a past history of liver disease. Patient does not have a family history of liver disease. Patient does not  have associated signs or symptoms related to liver disease.  Labs reviewed and confirm chronic hepatitis C with a positive viral load.   Records reviewed from Epic,  He has had a previous pneumovax.  Has seen cardiology for carotid dopplers.       Patient does not have documented immunity to Hepatitis A. Patient does not have documented immunity to Hepatitis B.    Review of Systems:  Constitutional: positive for fatigue and malaise or negative for anorexia Gastrointestinal: negative for diarrhea Musculoskeletal: negative for arthralgias All other systems reviewed and are negative       No past medical history on file.  Prior to Admission medications   Medication Sig Start Date End Date Taking? Authorizing Provider  aspirin 500 MG tablet Take 500 mg by mouth daily.   Yes [provider]  gabapentin (NEURONTIN) 400 MG capsule Take 1 capsule (400 mg total) by mouth 3 (three) times daily. 10/02/16  Yes Massie MaroonHollis, Lachina M, FNP    Allergies  Allergen Reactions  . Tricor [Fenofibrate]     Chest pain    Social History  Substance Use Topics  . Smoking status: Current Every Day Smoker    Packs/day: 0.50    Types: Cigarettes  . Smokeless tobacco: Never Used  . Alcohol use 8.4 oz/week      14 Cans of beer per week     Comment: socially    Family History  Problem Relation Age of Onset  . Hypertension Mother   . Cancer Father   No liver cancer, no cirrhosis   Objective:  Constitutional: in no apparent distress and alert,  Vitals:   11/16/16 1009  BP: 127/83  Pulse: (!) 56  Temp: 97.8 F (36.6 C)   Eyes: anicteric Cardiovascular: Cor RRR Respiratory: CTA B; normal respiratory effort Gastrointestinal: Bowel sounds are normal, liver is not enlarged, spleen is not enlarged Musculoskeletal: no pedal edema noted Skin: negatives: no rash; no porphyria cutanea tarda Lymphatic: no cervical lymphadenopathy   Laboratory Genotype: No results found for: HCVGENOTYPE HCV viral load:  Lab Results  Component Value Date   HCVQUANT 987,000 (H) 09/30/2016   Lab Results  Component Value Date   WBC 11.0 (H) 09/30/2016   HGB 13.4 09/30/2016   HCT 39.4 09/30/2016   MCV 91.0 09/30/2016   PLT 265 09/30/2016    Lab Results  Component Value Date   CREATININE 1.54 (H) 06/04/2015   BUN 16 06/04/2015   NA 135 06/04/2015   K 4.1 06/04/2015   CL 99 06/04/2015   CO2 27 06/04/2015    Lab Results  Component Value Date   ALT 31 06/04/2015   AST 39 (H) 06/04/2015   ALKPHOS 60 06/04/2015     Labs and history reviewed and show CHILD-PUGH A  5-6 points: Child class  A 7-9 points: Child class B 10-15 points: Child class C  Lab Results  Component Value Date   BILITOT 0.4 06/04/2015   ALBUMIN 4.0 06/04/2015     Assessment: New Patient with Chronic Hepatitis C genotype unknown, untreated.  I discussed with the patient the lab findings that confirm chronic hepatitis C as well as the natural history and progression of disease including about 30% of people who develop cirrhosis of the liver if left untreated and once cirrhosis is established there is a 2-7% risk per year of liver cancer and liver failure.  I discussed the importance of treatment and benefits in reducing the  risk, even if significant liver fibrosis exists.   Plan: 1) Patient counseled extensively on limiting acetaminophen to no more than 2 grams daily, avoidance of alcohol. 2) Transmission discussed with patient including sexual transmission, sharing razors and toothbrush.   3) Will need referral to gastroenterology if concern for cirrhosis 4) Will need referral for substance abuse counseling: No.; Further work up to include urine drug screen  No. 5) Will prescribe appropriate medication based on genotype and coverage  6) Hepatitis A and B titers 7) Pneumovax vaccine previously given 9) Further work up to include liver staging with elastography 10) will follow up after starting medication

## 2016-11-16 NOTE — Patient Instructions (Signed)
Date 11/16/16  Dear [name], As discussed in the ID Clinic, your hepatitis C therapy will include highly effective medication(s) for treatment and will vary based on the type of hepatitis C and insurance approval.  Potential medications include:          Harvoni (sofosbuvir 90mg /ledipasvir 400mg ) tablet oral daily          OR     Epclusa (sofosbuvir 400mg /velpatasvir 100mg ) tablet oral daily          OR      Mavyret (glecaprevir 100 mg/pibrentasvir 40 mg): Take 3 tablets oral daily          OR     Zepatier (elbasvir 50 mg/grazoprevir 100 mg) oral daily, +/- ribavirin              Medications are typically for 8 or 12 weeks total ---------------------------------------------------------------- Your HCV Treatment Start Date: You will be notified by our office once the medication is approved and where you can pick it up (or if mailed)   ---------------------------------------------------------------- YOUR PHARMACY CONTACT:   [facility] [address] [place], [place] [address] Phone: [phone] Hours: Monday to Friday 7:30 am to 6:00 pm   Please always contact your pharmacy at least 3-4 business days before you run out of medications to ensure your next month's medication is ready or 1 week prior to running out if you receive it by mail.  Remember, each prescription is for 28 days. ---------------------------------------------------------------- GENERAL NOTES REGARDING YOUR HEPATITIS C MEDICATION:  Some medications have the following interactions:  - Acid reducing agents such as H2 blockers (ie. Pepcid (famotidine), Zantac (ranitidine), Tagamet (cimetidine), Axid (nizatidine) and proton pump inhibitors (ie. Prilosec (omeprazole), Protonix (pantoprazole), Nexium (esomeprazole), or Aciphex (rabeprazole)). Do not take until you have discussed with a health care provider.    -Antacids that contain magnesium and/or aluminum hydroxide (ie. Milk of Magensia, Rolaids,  Gaviscon, Maalox, Mylanta, an dArthritis Pain Formula).  -Calcium carbonate (calcium supplements or antacids such as Tums, Caltrate, Os-Cal).  -St. John's wort or any products that contain St. John's wort like some herbal supplements  Please inform the office prior to starting any of these medications.  - The common side effects associated with Harvoni include:      1. Fatigue      2. Headache      3. Nausea      4. Diarrhea      5. Insomnia  Please note that this only lists the most common side effects and is NOT a comprehensive list of the potential side effects of these medications. For more information, please review the drug information sheets that come with your medication package from the pharmacy.  ---------------------------------------------------------------- GENERAL HELPFUL HINTS ON HCV THERAPY: 1. Stay well-hydrated. 2. Notify the ID Clinic of any changes in your other over-the-counter/herbal or prescription medications. 3. If you miss a dose of your medication, take the missed dose as soon as you remember. Return to your regular time/dose schedule the next day.  4.  Do not stop taking your medications without first talking with your healthcare provider. 5.  You may take Tylenol (acetaminophen), as long as the dose is less than 2000 mg (OR no more than 4 tablets of the Tylenol Extra Strengths 500mg  tablet) in 24 hours. 6.  You will see our pharmacist-specialist within the first 2 weeks of starting your medication to monitor for any possible side effects. 7.  You will have labs once during treatment, soon  after treatment completion and one final lab 6 months after treatment completion to verify the virus is out of your system.  Scharlene Gloss, [place] for [place], [place]  [phone]

## 2016-11-17 LAB — COMPLETE METABOLIC PANEL WITH GFR
ALT: 20 U/L (ref 9–46)
AST: 37 U/L — ABNORMAL HIGH (ref 10–35)
Albumin: 3.9 g/dL (ref 3.6–5.1)
Alkaline Phosphatase: 48 U/L (ref 40–115)
BILIRUBIN TOTAL: 0.4 mg/dL (ref 0.2–1.2)
BUN: 19 mg/dL (ref 7–25)
CHLORIDE: 101 mmol/L (ref 98–110)
CO2: 26 mmol/L (ref 20–31)
Calcium: 9.2 mg/dL (ref 8.6–10.3)
Creat: 1.51 mg/dL — ABNORMAL HIGH (ref 0.70–1.33)
GFR, Est African American: 58 mL/min — ABNORMAL LOW (ref >=60)
GFR, Est Non African American: 50 mL/min — ABNORMAL LOW (ref >=60)
Glucose, Bld: 77 mg/dL (ref 65–99)
Potassium: 4 mmol/L (ref 3.5–5.3)
Sodium: 137 mmol/L (ref 135–146)
Total Protein: 7.4 g/dL (ref 6.1–8.1)

## 2016-11-17 LAB — HEPATITIS B CORE ANTIBODY, TOTAL: Hep B Core Total Ab: REACTIVE — AB

## 2016-11-17 LAB — HEPATITIS B SURFACE ANTIBODY,QUALITATIVE: HEP B S AB: POSITIVE — AB

## 2016-11-17 LAB — HEPATITIS A ANTIBODY, TOTAL: Hep A Total Ab: NONREACTIVE

## 2016-11-17 LAB — PROTIME-INR
INR: 1
PROTHROMBIN TIME: 10.8 s (ref 9.0–11.5)

## 2016-11-17 LAB — HEPATITIS B SURFACE ANTIGEN: HEP B S AG: NEGATIVE

## 2016-11-18 ENCOUNTER — Telehealth (HOSPITAL_COMMUNITY): Payer: Self-pay

## 2016-11-18 NOTE — Telephone Encounter (Signed)
Encounter complete. 

## 2016-11-19 ENCOUNTER — Ambulatory Visit (HOSPITAL_COMMUNITY)
Admission: RE | Admit: 2016-11-19 | Discharge: 2016-11-19 | Disposition: A | Discharge status: Home / Self Care | Payer: Medicaid Other | Source: Ambulatory Visit | Attending: Cardiology | Admitting: Cardiology

## 2016-11-19 ENCOUNTER — Other Ambulatory Visit: Payer: Medicaid Other

## 2016-11-19 DIAGNOSIS — R001 Bradycardia, unspecified: Secondary | ICD-10-CM | POA: Insufficient documentation

## 2016-11-19 DIAGNOSIS — Z7689 Persons encountering health services in other specified circumstances: Secondary | ICD-10-CM

## 2016-11-19 DIAGNOSIS — R0989 Other specified symptoms and signs involving the circulatory and respiratory systems: Secondary | ICD-10-CM | POA: Diagnosis present

## 2016-11-19 DIAGNOSIS — I6523 Occlusion and stenosis of bilateral carotid arteries: Secondary | ICD-10-CM | POA: Diagnosis not present

## 2016-11-19 LAB — EXERCISE TOLERANCE TEST
CHL CUP MPHR: 162 {beats}/min
CHL CUP RESTING HR STRESS: 43 {beats}/min
CHL RATE OF PERCEIVED EXERTION: 19
CSEPEDS: 21 s
CSEPHR: 69 %
Estimated workload: 7.5 METS
Exercise duration (min): 6 min
Peak HR: 112 {beats}/min

## 2016-11-20 ENCOUNTER — Other Ambulatory Visit: Payer: Self-pay | Admitting: Family Medicine

## 2016-11-20 ENCOUNTER — Ambulatory Visit (INDEPENDENT_AMBULATORY_CARE_PROVIDER_SITE_OTHER): Payer: Medicaid Other | Admitting: Family Medicine

## 2016-11-20 ENCOUNTER — Encounter: Payer: Self-pay | Admitting: Family Medicine

## 2016-11-20 VITALS — BP 124/72 | HR 50 | Temp 97.7°F | Resp 14 | Ht 69.0 in | Wt 156.0 lb

## 2016-11-20 DIAGNOSIS — E039 Hypothyroidism, unspecified: Secondary | ICD-10-CM

## 2016-11-20 DIAGNOSIS — R7989 Other specified abnormal findings of blood chemistry: Secondary | ICD-10-CM

## 2016-11-20 DIAGNOSIS — R221 Localized swelling, mass and lump, neck: Secondary | ICD-10-CM

## 2016-11-20 DIAGNOSIS — B182 Chronic viral hepatitis C: Secondary | ICD-10-CM

## 2016-11-20 DIAGNOSIS — F17200 Nicotine dependence, unspecified, uncomplicated: Secondary | ICD-10-CM

## 2016-11-20 DIAGNOSIS — R946 Abnormal results of thyroid function studies: Secondary | ICD-10-CM

## 2016-11-20 LAB — LIVER FIBROSIS, FIBROTEST-ACTITEST
ALT: 20 U/L (ref 9–46)
APOLIPOPROTEIN A1: 133 mg/dL (ref 94–176)
Alpha-2-Macroglobulin: 317 mg/dL — ABNORMAL HIGH (ref 106–279)
BILIRUBIN: 0.3 mg/dL (ref 0.2–1.2)
Fibrosis Score: 0.25
GGT: 10 U/L (ref 3–85)
HAPTOGLOBIN: 196 mg/dL (ref 43–212)
Necroinflammat ACT Score: 0.07
Reference ID: 1950857

## 2016-11-20 LAB — TSH: TSH: 288.6 u[IU]/mL — AB (ref 0.450–4.500)

## 2016-11-20 LAB — BASIC METABOLIC PANEL
BUN/Creatinine Ratio: 7 — ABNORMAL LOW (ref 9–20)
BUN: 10 mg/dL (ref 6–24)
CALCIUM: 9.6 mg/dL (ref 8.7–10.2)
CHLORIDE: 99 mmol/L (ref 96–106)
CO2: 26 mmol/L (ref 18–29)
Creatinine, Ser: 1.4 mg/dL — ABNORMAL HIGH (ref 0.76–1.27)
GFR calc Af Amer: 64 mL/min/{1.73_m2} (ref >59)
GFR, EST NON AFRICAN AMERICAN: 55 mL/min/{1.73_m2} — AB (ref >59)
GLUCOSE: 77 mg/dL (ref 65–99)
POTASSIUM: 4.3 mmol/L (ref 3.5–5.2)
Sodium: 138 mmol/L (ref 134–144)

## 2016-11-20 LAB — HEPATITIS C GENOTYPE: HCV Genotype: 2

## 2016-11-20 LAB — T4, FREE: Free T4: <0.11 ng/dL — ABNORMAL LOW (ref 0.82–1.77)

## 2016-11-20 MED ORDER — LEVOTHYROXINE SODIUM 112 MCG PO TABS: 112.0000 ug | ORAL_TABLET | Freq: Every day | ORAL | 1 refills | Status: DC

## 2016-11-20 NOTE — Patient Instructions (Addendum)
Will start Levothyroxine at 112 mcg every morning 30 minutes to 1 hour prior to eating breakfast.  Will follow up in office in 4 weeks.  Will schedule ultrasound to thyroid in the next several weeks.   Levothyroxine tablets What is this medicine? LEVOTHYROXINE (lee voe thye ROX een) is a thyroid hormone. This medicine can improve symptoms of thyroid deficiency such as slow speech, lack of energy, weight gain, hair loss, dry skin, and feeling cold. It also helps to treat goiter (an enlarged thyroid gland). It is also used to treat some kinds of thyroid cancer along with surgery and other medicines. This medicine may be used for other purposes; ask your health care provider or pharmacist if you have questions. COMMON BRAND NAME(S): Estre, Levo-T, Levothroid, Levoxyl, Synthroid, Thyro-Tabs, Unithroid What should I tell my health care provider before I take this medicine? They need to know if you have any of these conditions: -angina -blood clotting problems -diabetes -dieting or on a weight loss program -fertility problems -heart disease -high levels of thyroid hormone -pituitary gland problem -previous heart attack -an unusual or allergic reaction to levothyroxine, thyroid hormones, other medicines, foods, dyes, or preservatives -pregnant or trying to get pregnant -breast-feeding How should I use this medicine? Take this medicine by mouth with plenty of water. It is best to take on an empty stomach, at least 30 minutes before or 2 hours after food. Follow the directions on the prescription label. Take at the same time each day. Do not take your medicine more often than directed. Contact your pediatrician regarding the use of this medicine in children. While this drug may be prescribed for children and infants as young as a few days of age for selected conditions, precautions do apply. For infants, you may crush the tablet and place in a small amount of (5-10 ml or 1 to 2 teaspoonfuls) of  water, breast milk, or non-soy based infant formula. Do not mix with soy-based infant formula. Give as directed. Overdosage: If you think you have taken too much of this medicine contact a poison control center or emergency room at once. NOTE: This medicine is only for you. Do not share this medicine with others. What if I miss a dose? If you miss a dose, take it as soon as you can. If it is almost time for your next dose, take only that dose. Do not take double or extra doses. What may interact with this medicine? -amiodarone -antacids -anti-thyroid medicines -calcium supplements -carbamazepine -cholestyramine -colestipol -digoxin -male hormones, including contraceptive or birth control pills -iron supplements -ketamine -liquid nutrition products like Ensure -medicines for colds and breathing difficulties -medicines for diabetes -medicines for mental depression -medicines or herbals used to decrease weight or appetite -phenobarbital or other barbiturate medications -phenytoin -prednisone or other corticosteroids -rifabutin -rifampin -soy isoflavones -sucralfate -theophylline -warfarin This list may not describe all possible interactions. Give your health care provider a list of all the medicines, herbs, non-prescription drugs, or dietary supplements you use. Also tell them if you smoke, drink alcohol, or use illegal drugs. Some items may interact with your medicine. What should I watch for while using this medicine? Be sure to take this medicine with plenty of fluids. Some tablets may cause choking, gagging, or difficulty swallowing from the tablet getting stuck in your throat. Most of these problems disappear if the medicine is taken with the right amount of water or other fluids. Do not switch brands of this medicine unless your health care professional agrees  with the change. Ask questions if you are uncertain. You will need regular exams and occasional blood tests to check  the response to treatment. If you are receiving this medicine for an underactive thyroid, it may be several weeks before you notice an improvement. Check with your doctor or health care professional if your symptoms do not improve. It may be necessary for you to take this medicine for the rest of your life. Do not stop using this medicine unless your doctor or health care professional advises you to. This medicine can affect blood sugar levels. If you have diabetes, check your blood sugar as directed. You may lose some of your hair when you first start treatment. With time, this usually corrects itself. If you are going to have surgery, tell your doctor or health care professional that you are taking this medicine. What side effects may I notice from receiving this medicine? Side effects that you should report to your doctor or health care professional as soon as possible: -allergic reactions like skin rash, itching or hives, swelling of the face, lips, or tongue -chest pain -excessive sweating or intolerance to heat -fast or irregular heartbeat -nervousness -skin rash or hives -swelling of ankles, feet, or legs -tremors Side effects that usually do not require medical attention (report to your doctor or health care professional if they continue or are bothersome): -changes in appetite -changes in menstrual periods -diarrhea -hair loss -headache -trouble sleeping -weight loss This list may not describe all possible side effects. Call your doctor for medical advice about side effects. You may report side effects to FDA at 1-800-FDA-1088. Where should I keep my medicine? Keep out of the reach of children. Store at room temperature between 15 and 30 degrees C (59 and 86 degrees F). Protect from light and moisture. Keep container tightly closed. Throw away any unused medicine after the expiration date. NOTE: This sheet is a summary. It may not cover all possible information. If you have  questions about this medicine, talk to your doctor, pharmacist, or health care provider.  2018 Elsevier/Gold Standard (2008-09-28 14:28:07)

## 2016-11-20 NOTE — Progress Notes (Signed)
Subjective:    Patient ID: Javier Bridges, male    DOB: June 15, 1961, 56 y.o.   MRN: [id number]  HPI Mr. Jonh Mcqueary, a 56 year old male with a history of chronic back pain and hepatitis C presents for an elevated TSH. Mr. Balboa was referred to cardiology for bradycardia and an abnormal ECG greater than 1 month ago. He presented for an exercise stress test on 11/19/2016 and was found to have a markedly elevated TSH and decreased T4, which is consistent with acquired hypothyroidism.  Hypothyroidism  Current symptoms include fatigue, intolerance to cold, and dry skin.Patient denies diarrhea, nervousness and palpitations. Symptoms have gradually worsened.The symptoms are moderate.   Past Medical History:  Diagnosis Date  . Bradycardia   . Chronic back pain   . Hepatitis C, chronic (HCC)   . Hypothyroidism   . Tobacco dependence    Social History   Social History  . Marital status: Single    Spouse name: N/A  . Number of children: N/A  . Years of education: N/A   Occupational History  . Not on file.   Social History Main Topics  . Smoking status: Current Every Day Smoker    Packs/day: 0.50    Types: Cigarettes  . Smokeless tobacco: Never Used  . Alcohol use 8.4 oz/week    14 Cans of beer per week     Comment: socially  . Drug use: Yes    Types: Marijuana     Comment: socially  . Sexual activity: No   Other Topics Concern  . Not on file   Social History Narrative  . No narrative on file   Immunization History  Administered Date(s) Administered  . Pneumococcal Polysaccharide-23 05/03/2015  . Tdap 09/30/2016    Review of Systems  Constitutional: Positive for fatigue. Negative for fever.  HENT: Negative.   Eyes: Negative.  Negative for photophobia.  Respiratory: Negative.  Negative for shortness of breath.   Cardiovascular: Negative for chest pain and palpitations.  Gastrointestinal: Negative for constipation.  Endocrine: Positive for cold intolerance. Negative for  heat intolerance.  Genitourinary: Negative.   Musculoskeletal: Positive for back pain and myalgias.  Allergic/Immunologic: Positive for immunocompromised state (hepatitis C).  Neurological: Positive for weakness. Negative for dizziness, light-headedness and numbness.  Hematological: Negative.   Psychiatric/Behavioral: Negative.       Objective:   Physical Exam  Constitutional: He appears well-developed and well-nourished.  HENT:  Head: Normocephalic and atraumatic.  Right Ear: External ear normal.  Left Ear: External ear normal.  Nose: Nose normal.  Mouth/Throat: Oropharynx is clear and moist.  Neck: Full passive range of motion without pain. Thyromegaly present.  Cardiovascular: Bradycardia present.   Pulmonary/Chest: Effort normal.  Abdominal: Soft. Normal appearance.  Neurological: He is alert.  Reflex Scores:      Patellar reflexes are 1+ on the right side and 1+ on the left side.      Achilles reflexes are 1+ on the right side and 1+ on the left side. Skin: Skin is dry.  Dry, flaking     BP 124/72 (BP Location: Left Arm, Patient Position: Sitting, Cuff Size: Normal)   Pulse (!) 50   Temp 97.7 F (36.5 C) (Oral)   Resp 14   Ht 5\' 9"  (1.753 m)   Wt 156 lb (70.8 kg)   SpO2 99%   BMI 23.04 kg/m  Assessment & Plan:  1. Acquired hypothyroidism On physical exam, Mr. Deboard was found to have dry skin, dull facial  expression, bradycardia, intolerance to cold and delayed reflexes. Reviewed labs TSH 288 and T4 < 0.11. Will start levothyroxine at 112 mcg and check levels in 4 weeks.  - levothyroxine (SYNTHROID, LEVOTHROID) 112 MCG tablet; Take 1 tablet (112 mcg total) by mouth daily before breakfast.  Dispense: 30 tablet; Refill: 1 - TSH; Future - T4, Free; Future  2. Elevated TSH - US SOFT TISSUE NECK; Future - TSH; Future - T4, Free; Future  3. Neck fullness - US SOFT TISSUE NECK; Future  4. Chronic hepatitis C without hepatic coma (HCC) Continue to follow up with  Dr. Luciana Axeomer as scheduled.   5. Tobacco dependence Smoking cessation instruction/counseling given:  counseled patient on the dangers of tobacco use, advised patient to stop smoking, and reviewed strategies to maximize success    RTC: 4 weeks to re-check TSH and T4  Nolon NationsLaChina Moore Lyndsi Altic  MSN, FNP-C [facility] [address]  [place], [place] [address] [phone]

## 2016-11-21 MED ORDER — GLECAPREVIR-PIBRENTASVIR 100-40 MG PO TABS: 3.0000 | ORAL_TABLET | Freq: Every day | ORAL | 1 refills | Status: DC

## 2016-11-21 NOTE — Addendum Note (Signed)
Addended by: Gardiner BarefootOMER, Erhardt Dada W on: 11/21/2016 10:40 AM   Modules accepted: Orders

## 2016-12-01 ENCOUNTER — Telehealth: Payer: Self-pay

## 2016-12-01 DIAGNOSIS — I779 Disorder of arteries and arterioles, unspecified: Secondary | ICD-10-CM

## 2016-12-01 DIAGNOSIS — I739 Peripheral vascular disease, unspecified: Principal | ICD-10-CM

## 2016-12-01 MED FILL — MAVYRET 100-40 MG TABS: 100-40 | 28 days supply | Qty: 84 | Fill #0

## 2016-12-01 NOTE — Telephone Encounter (Signed)
Notes recorded by Ethelda ChickPate Ingalls, Jeremi Losito, RN on 12/01/2016 at 11:14 AM EDT Patient aware of results. Per Dr. Eden EmmsNishan, needs CT angio of neck and carotids for proximal CCA stenosis. Patient verbalized understanding and someone from scheduling will call patient with appointment for CT.

## 2016-12-01 NOTE — Telephone Encounter (Signed)
-----   Message from Wendall StadePeter C Nishan, MD sent at 12/01/2016  6:56 AM EDT ----- Needs CT angio of neck and carotids for proximal CCA stenosis

## 2016-12-07 ENCOUNTER — Inpatient Hospital Stay: Admission: RE | Admit: 2016-12-07 | Payer: Medicaid Other | Source: Ambulatory Visit

## 2016-12-07 NOTE — Telephone Encounter (Signed)
Will have scheduling call patient to set up for carotid duplex in 6 months.   ----- Message -----  From: Wendall StadeNishan, Peter C, MD  Sent: 12/04/2016  3:35 PM  To: Britt Bologneseharmaine M Hall  Subject: RE: Denied CTA Neck for Monday 12-07-16       Just have him have f/u duplex in 6 months   ----- Message -----  From: Britt BologneseHall, Charmaine M  Sent: 12/04/2016 12:26 PM  To: Wendall StadePeter C Nishan, MD  Subject: Denied CTA Neck for Monday 12-07-16         Dr. Eden EmmsNishan   Your notes and carotid imaging results were uploaded to [employer] (precert [facility]).   Case went to MD Clinical review and was denied d/t:   Carotid stenosis not greater than or equal to 70%.   If you would like to call and discuss the denied case please call [phone]. [employer]#[id number]#[id number]   Please Advise   Charmaine

## 2016-12-11 ENCOUNTER — Encounter: Payer: Self-pay | Admitting: Pharmacy Technician

## 2016-12-15 ENCOUNTER — Ambulatory Visit (INDEPENDENT_AMBULATORY_CARE_PROVIDER_SITE_OTHER): Payer: Medicaid Other | Admitting: Pharmacist Clinician (PhC)/ Clinical Pharmacy Specialist

## 2016-12-15 DIAGNOSIS — B182 Chronic viral hepatitis C: Secondary | ICD-10-CM | POA: Diagnosis not present

## 2016-12-15 DIAGNOSIS — Z23 Encounter for immunization: Secondary | ICD-10-CM

## 2016-12-15 NOTE — Patient Instructions (Signed)
Continue your Mavyret for 2 months Come back for labs in 2 weeks

## 2016-12-15 NOTE — Progress Notes (Signed)
HPI: Bjorn PippinRandy L Neumeier is a 56 y.o. male who is here for his pharmacy hep C visit.   Lab Results  Component Value Date   HCVGENOTYPE 2 11/16/2016    Allergies: Allergies  Allergen Reactions  . Tricor [Fenofibrate]     Chest pain    Vitals:    Past Medical History: Past Medical History:  Diagnosis Date  . Bradycardia   . Chronic back pain   . Hepatitis C, chronic (HCC)   . Hypothyroidism   . Tobacco dependence     Social History: Social History   Social History  . Marital status: Single    Spouse name: N/A  . Number of children: N/A  . Years of education: N/A   Social History Main Topics  . Smoking status: Current Every Day Smoker    Packs/day: 0.50    Types: Cigarettes  . Smokeless tobacco: Never Used  . Alcohol use 8.4 oz/week    14 Cans of beer per week     Comment: socially  . Drug use: Yes    Types: Marijuana     Comment: socially  . Sexual activity: No   Other Topics Concern  . Not on file   Social History Narrative  . No narrative on file    Labs: Hep B S Ab (no units)  Date Value  11/16/2016 POS (A)   Hepatitis B Surface Ag (no units)  Date Value  11/16/2016 NEGATIVE   HCV Ab (no units)  Date Value  09/30/2016 REACTIVE (A)    Lab Results  Component Value Date   HCVGENOTYPE 2 11/16/2016    Hepatitis C RNA quantitative Latest Ref Rng & Units 09/30/2016  HCV Quantitative NOT DETECTED IU/mL 987,000(H)  HCV Quantitative Log NOT DETECTED Log IU/mL 5.99(H)    AST (U/L)  Date Value  11/16/2016 37 (H)  06/04/2015 39 (H)  01/17/2015 49 (H)   ALT (U/L)  Date Value  11/16/2016 20  11/16/2016 20  06/04/2015 31  01/17/2015 40   INR (no units)  Date Value  11/16/2016 1.0    CrCl: CrCl cannot be calculated (Patient's most recent lab result is older than the maximum 21 days allowed.).  Fibrosis Score: F0/1 as assessed by Fibrosure  Child-Pugh Score: Class A  Previous Treatment Regimen: None  Assessment: Harvie HeckRandy started on  his Mavyret on 5/30 for his genotype 2 hepatitis C. He has not missed any doses. He has no hx of IVDA. He probably acquired it through tattoos. He has not missed any doses so far and has experience no side effects. He will only need 8 wks of Mavyret through Medicaid. He is hep A ab neg so we'll start the series today. He is going to come back in  2wks for his VL then [id number]/SVR12 VL. He will see pharmacy for his cure visit.   Recommendations:  Hep A #1 today Cont Mavyret x 8 wks Hep C VL in 2 wks [id number]/SVR12 VL then cure visit with pharmacy  [facility]-Phoenix[facility]h Jonanthony Nahar, [place]., BCPS, AAHIVP Clinical Infectious Disease Pharmacist [facility] 12/15/2016, 2:51 PM

## 2016-12-22 MED FILL — MAVYRET 100-40 MG TABS: 100-40 | 28 days supply | Qty: 84 | Fill #1

## 2016-12-23 ENCOUNTER — Encounter: Payer: Medicaid Other | Admitting: Internal Medicine

## 2016-12-25 ENCOUNTER — Other Ambulatory Visit: Payer: Self-pay | Admitting: Family Medicine

## 2016-12-25 ENCOUNTER — Ambulatory Visit (INDEPENDENT_AMBULATORY_CARE_PROVIDER_SITE_OTHER): Payer: Medicaid Other | Admitting: Family Medicine

## 2016-12-25 ENCOUNTER — Ambulatory Visit (HOSPITAL_COMMUNITY)
Admission: RE | Admit: 2016-12-25 | Discharge: 2016-12-25 | Disposition: A | Discharge status: Home / Self Care | Payer: Medicaid Other | Source: Ambulatory Visit | Attending: Family Medicine | Admitting: Family Medicine

## 2016-12-25 ENCOUNTER — Encounter: Payer: Self-pay | Admitting: Family Medicine

## 2016-12-25 VITALS — BP 103/65 | HR 70 | Temp 98.0°F | Resp 16 | Ht 69.0 in | Wt 145.0 lb

## 2016-12-25 DIAGNOSIS — E039 Hypothyroidism, unspecified: Secondary | ICD-10-CM

## 2016-12-25 DIAGNOSIS — R062 Wheezing: Secondary | ICD-10-CM

## 2016-12-25 DIAGNOSIS — R0600 Dyspnea, unspecified: Secondary | ICD-10-CM | POA: Diagnosis not present

## 2016-12-25 DIAGNOSIS — F17200 Nicotine dependence, unspecified, uncomplicated: Secondary | ICD-10-CM

## 2016-12-25 DIAGNOSIS — J449 Chronic obstructive pulmonary disease, unspecified: Secondary | ICD-10-CM

## 2016-12-25 MED ORDER — NICOTINE 14 MG/24HR TD PT24: 14.0000 mg | MEDICATED_PATCH | Freq: Every day | TRANSDERMAL | 2 refills | Status: DC

## 2016-12-25 MED ORDER — IPRATROPIUM BROMIDE 0.02 % IN SOLN
0.5000 mg | Freq: Once | RESPIRATORY_TRACT | Status: AC
  Administered 2016-12-25: 0.5 mg via RESPIRATORY_TRACT

## 2016-12-25 MED ORDER — ALBUTEROL SULFATE (2.5 MG/3ML) 0.083% IN NEBU
2.5000 mg | INHALATION_SOLUTION | Freq: Once | RESPIRATORY_TRACT | Status: AC
  Administered 2016-12-25: 2.5 mg via RESPIRATORY_TRACT

## 2016-12-25 MED ORDER — ALBUTEROL SULFATE HFA 108 (90 BASE) MCG/ACT IN AERS: 2.0000 | INHALATION_SPRAY | Freq: Four times a day (QID) | RESPIRATORY_TRACT | 5 refills | Status: DC | PRN

## 2016-12-25 MED ORDER — BUDESONIDE-FORMOTEROL FUMARATE 80-4.5 MCG/ACT IN AERO: 2.0000 | INHALATION_SPRAY | Freq: Two times a day (BID) | RESPIRATORY_TRACT | 5 refills | Status: DC

## 2016-12-25 MED FILL — NICOTINE 14 MG/24HR PATCH: 14 | 28 days supply | Qty: 28 | Fill #0

## 2016-12-25 MED FILL — PROAIR HFA 90 MCG INHALER: 108 (90 BAS | 25 days supply | Qty: 9 | Fill #0

## 2016-12-25 NOTE — Patient Instructions (Addendum)
Wheezing:  Lung sounds improved after nebulizer treatment Will review chest xray and follow up by phone with results Albuterol rescue inhaler every 6 hours as needed (persistent cough, wheezing, shortness of breath)   Hypothyroidism:  Resume levothyroxine 122 mcg Will follow up by phone with TSH results  Tobacco dependence:  Apply nicotine patch to deltoid daily.  Refrain from smoking while wearing patch  Steps to Quit Smoking Smoking tobacco can be bad for your health. It can also affect almost every organ in your body. Smoking puts you and people around you at risk for many serious long-lasting (chronic) diseases. Quitting smoking is hard, but it is one of the best things that you can do for your health. It is never too late to quit. What are the benefits of quitting smoking? When you quit smoking, you lower your risk for getting serious diseases and conditions. They can include:  Lung cancer or lung disease.  Heart disease.  Stroke.  Heart attack.  Not being able to have children (infertility).  Weak bones (osteoporosis) and broken bones (fractures).  If you have coughing, wheezing, and shortness of breath, those symptoms may get better when you quit. You may also get sick less often. If you are pregnant, quitting smoking can help to lower your chances of having a baby of low birth weight. What can I do to help me quit smoking? Talk with your doctor about what can help you quit smoking. Some things you can do (strategies) include:  Quitting smoking totally, instead of slowly cutting back how much you smoke over a period of time.  Going to in-person counseling. You are more likely to quit if you go to many counseling sessions.  Using resources and support systems, such as: ? Agricultural engineer with a Veterinary surgeon. ? Phone quitlines. ? Automotive engineer. ? Support groups or group counseling. ? Text messaging programs. ? Mobile phone apps or applications.  Taking  medicines. Some of these medicines may have nicotine in them. If you are pregnant or breastfeeding, do not take any medicines to quit smoking unless your doctor says it is okay. Talk with your doctor about counseling or other things that can help you.  Talk with your doctor about using more than one strategy at the same time, such as taking medicines while you are also going to in-person counseling. This can help make quitting easier. What things can I do to make it easier to quit? Quitting smoking might feel very hard at first, but there is a lot that you can do to make it easier. Take these steps:  Talk to your family and friends. Ask them to support and encourage you.  Call phone quitlines, reach out to support groups, or work with a Veterinary surgeon.  Ask people who smoke to not smoke around you.  Avoid places that make you want (trigger) to smoke, such as: ? Bars. ? Parties. ? Smoke-break areas at work.  Spend time with people who do not smoke.  Lower the stress in your life. Stress can make you want to smoke. Try these things to help your stress: ? Getting regular exercise. ? Deep-breathing exercises. ? Yoga. ? Meditating. ? Doing a body scan. To do this, close your eyes, focus on one area of your body at a time from head to toe, and notice which parts of your body are tense. Try to relax the muscles in those areas.  Download or buy apps on your mobile phone or tablet that can help  you stick to your quit plan. There are many free apps, such as QuitGuide from the [employer] developer(Centers for Disease Control and Prevention). You can find more support from smokefree.gov and other websites.  This information is not intended to replace advice given to you by your health care provider. Make sure you discuss any questions you have with your health care provider. Document Released: 04/18/2009 Document Revised: 02/18/2016 Document Reviewed: 11/06/2014 Elsevier Interactive Patient Education  2018 [employer]  [employer]

## 2016-12-25 NOTE — Progress Notes (Signed)
Subjective:    Patient ID: Javier Bridges, male    DOB: 12/30/1960, 56 y.o.   MRN: [id number]  HPI Mr. Athanasius Kesling, a 56 year old male with a history of chronic back pain and hepatitis C presents for a follow up of hypothyroidism. He was found to have a markedly elevated TSH and decrease 4, which is consistent with acquired hypothyroidism. He was started on levothyroxine 112 mcg 1 month ago and has been taking medication consistently. He says that fatigue and intolerance has improved. Patient denies diarrhea, nervousness and palpitations.  Mr. Axel also complains of periodic shortness of breath.  Symptoms include difficulty breathing and dyspnea on exertion at times. Mr. Brittian has been a chronic everyday cigarette smoker for over 40 years. He smokes 0.5 pack per day, which has decreased from previous years.. Symptoms are exacerbated by moderate activity. Symptoms are alleviated by rest.    Past Medical History:  Diagnosis Date  . Bradycardia   . Chronic back pain   . Hepatitis C, chronic (HCC)   . Hypothyroidism   . Tobacco dependence    Social History   Social History  . Marital status: Single    Spouse name: N/A  . Number of children: N/A  . Years of education: N/A   Occupational History  . Not on file.   Social History Main Topics  . Smoking status: Current Every Day Smoker    Packs/day: 0.50    Types: Cigarettes  . Smokeless tobacco: Never Used  . Alcohol use 8.4 oz/week    14 Cans of beer per week     Comment: socially  . Drug use: Yes    Types: Marijuana     Comment: socially  . Sexual activity: No   Other Topics Concern  . Not on file   Social History Narrative  . No narrative on file   Immunization History  Administered Date(s) Administered  . Hepatitis A, Adult 12/15/2016  . Pneumococcal Polysaccharide-23 05/03/2015  . Tdap 09/30/2016    Review of Systems  Constitutional: Positive for fatigue. Negative for fever.  HENT: Negative.   Eyes:  Negative.  Negative for photophobia.  Respiratory: Positive for shortness of breath and wheezing.   Cardiovascular: Negative for chest pain and palpitations.  Gastrointestinal: Negative for constipation.  Endocrine: Negative for heat intolerance.  Genitourinary: Negative.   Musculoskeletal: Positive for back pain.  Allergic/Immunologic: Positive for immunocompromised state (hepatitis C).  Neurological: Positive for weakness. Negative for dizziness, light-headedness and numbness.  Hematological: Negative.   Psychiatric/Behavioral: Negative.       Objective:   Physical Exam  Constitutional: He appears well-developed and well-nourished.  HENT:  Head: Normocephalic and atraumatic.  Right Ear: External ear normal.  Left Ear: External ear normal.  Nose: Nose normal.  Mouth/Throat: Oropharynx is clear and moist.  Neck: Full passive range of motion without pain. Thyromegaly present.  Cardiovascular: Bradycardia present.   Pulmonary/Chest: Effort normal. He has wheezes in the right upper field, the right middle field and the left upper field.  Abdominal: Soft. Normal appearance.  Neurological: He is alert.  Skin: Skin is dry.  Dry, flaking     BP 103/65 (BP Location: Left Arm, Patient Position: Sitting, Cuff Size: Small)   Pulse 70   Temp 98 F (36.7 C) (Oral)   Resp 16   Ht 5\' 9"  (1.753 m)   Wt 145 lb (65.8 kg)   SpO2 98%   BMI 21.41 kg/m  Assessment & Plan:  1.  Acquired hypothyroidism Continue levothyroxine 112 mcg 30- 1 hour prior to meals. I will follow up by phone with laboratory results.  - Thyroid Panel With TSH  2. Wheezing I suspect COPD due to current symptoms and smoking history. I will send patient for a chest xray.  - DG Chest 2 View; Future - albuterol (PROVENTIL HFA;VENTOLIN HFA) 108 (90 Base) MCG/ACT inhaler; Inhale 2 puffs into the lungs every 6 (six) hours as needed for wheezing or shortness of breath.  Dispense: 1 Inhaler; Refill: 5 - albuterol (PROVENTIL)  (2.5 MG/3ML) 0.083% nebulizer solution 2.5 mg; Take 3 mLs (2.5 mg total) by nebulization once. - ipratropium (ATROVENT) nebulizer solution 0.5 mg; Take 2.5 mLs (0.5 mg total) by nebulization once.  3. Dyspnea, unspecified type - DG Chest 2 View; Future - albuterol (PROVENTIL HFA;VENTOLIN HFA) 108 (90 Base) MCG/ACT inhaler; Inhale 2 puffs into the lungs every 6 (six) hours as needed for wheezing or shortness of breath.  Dispense: 1 Inhaler; Refill: 5 - albuterol (PROVENTIL) (2.5 MG/3ML) 0.083% nebulizer solution 2.5 mg; Take 3 mLs (2.5 mg total) by nebulization once. - ipratropium (ATROVENT) nebulizer solution 0.5 mg; Take 2.5 mLs (0.5 mg total) by nebulization once.  4. Tobacco dependence Mr. Dubie and I discussed the importance of smoking cessation at length. He is in the contemplative state.   - nicotine (NICODERM CQ) 14 mg/24hr patch; Place 1 patch (14 mg total) onto the skin daily.  Dispense: 28 patch; Refill: 2   RTC: 3 months for hypothyroidism and chronic back pain   Nolon NationsLaChina Moore Zamiah Tollett  MSN, FNP-C [facility] [address]  [place], [place] [address] [phone]

## 2016-12-26 LAB — THYROID PANEL WITH TSH
Free Thyroxine Index: 2.4 (ref 1.4–3.8)
T3 UPTAKE: 32 % (ref 22–35)
T4, Total: 7.4 ug/dL (ref 4.5–12.0)
TSH: 2.7 m[IU]/L (ref 0.40–4.50)

## 2016-12-28 ENCOUNTER — Telehealth: Payer: Self-pay

## 2016-12-28 NOTE — Telephone Encounter (Signed)
Called, no answer. Left message for patient to call back when available. Thanks!  

## 2016-12-28 NOTE — Telephone Encounter (Signed)
-----   Message from Massie MaroonLachina M Hollis, [place]NP sent at 12/28/2016  4:04 PM EDT ----- Regarding: lab results Please inform Mr. Linna DarnerWyrick that TSH has normalized. Will continue levothyroxine at current dosage.  Also, chest xray is consistent with COPD, will continue Symbicort as prescribed. Follow up in office as scheduled.   Thanks ----- Message ----- From: Interface, Rad Results In Sent: 12/25/2016  12:15 PM To: Massie MaroonLachina M Hollis, FNP

## 2016-12-29 ENCOUNTER — Other Ambulatory Visit: Payer: Medicaid Other

## 2016-12-29 DIAGNOSIS — B182 Chronic viral hepatitis C: Secondary | ICD-10-CM

## 2016-12-29 MED FILL — SYMBICORT 80-4.5 MCG INH: 80-4.5 | 30 days supply | Qty: 10 | Fill #0

## 2016-12-29 NOTE — Telephone Encounter (Signed)
Called and spoke with patient, advised of tsh being normal and was instructed to continue levothyroxine as current dose. Advised of xray results and that patient should take the symbicort as directed. Prior authorization was needed for symbicort and this has been completed and approved. Patient was asked to pick this up from pharmacy asap and take as directed. Thanks!

## 2016-12-31 LAB — HEPATITIS C RNA QUANTITATIVE
HCV QUANT: NOT DETECTED [IU]/mL
HCV Quantitative Log: <1.18 Log IU/mL

## 2017-01-28 ENCOUNTER — Ambulatory Visit: Payer: Medicaid Other

## 2017-02-01 ENCOUNTER — Ambulatory Visit (INDEPENDENT_AMBULATORY_CARE_PROVIDER_SITE_OTHER): Payer: Medicaid Other | Admitting: Pharmacist Clinician (PhC)/ Clinical Pharmacy Specialist

## 2017-02-01 DIAGNOSIS — B182 Chronic viral hepatitis C: Secondary | ICD-10-CM

## 2017-02-01 LAB — COMPLETE METABOLIC PANEL WITH GFR
ALT: 8 U/L — ABNORMAL LOW (ref 9–46)
AST: 16 U/L (ref 10–35)
Albumin: 3.3 g/dL — ABNORMAL LOW (ref 3.6–5.1)
Alkaline Phosphatase: 118 U/L — ABNORMAL HIGH (ref 40–115)
BILIRUBIN TOTAL: 0.2 mg/dL (ref 0.2–1.2)
BUN: 11 mg/dL (ref 7–25)
CHLORIDE: 103 mmol/L (ref 98–110)
CO2: 27 mmol/L (ref 20–31)
Calcium: 8.8 mg/dL (ref 8.6–10.3)
Creat: 0.98 mg/dL (ref 0.70–1.33)
GFR, EST NON AFRICAN AMERICAN: 85 mL/min (ref >=60)
GLUCOSE: 91 mg/dL (ref 65–99)
Potassium: 4.2 mmol/L (ref 3.5–5.3)
SODIUM: 137 mmol/L (ref 135–146)
Total Protein: 6.2 g/dL (ref 6.1–8.1)

## 2017-02-01 NOTE — Patient Instructions (Signed)
Come back for labs in November Follow up with us a week later for the cure visit and vaccine

## 2017-02-01 NOTE — Progress Notes (Signed)
HPI: Javier Bridges is a 56 y.o. male who is here for his EOT hep C visit.   Lab Results  Component Value Date   HCVGENOTYPE 2 11/16/2016    Allergies: Allergies  Allergen Reactions  . Tricor [Fenofibrate]     Chest pain    Vitals:    Past Medical History: Past Medical History:  Diagnosis Date  . Bradycardia   . Chronic back pain   . Hepatitis C, chronic (HCC)   . Hypothyroidism   . Tobacco dependence     Social History: Social History   Social History  . Marital status: Single    Spouse name: N/A  . Number of children: N/A  . Years of education: N/A   Social History Main Topics  . Smoking status: Current Every Day Smoker    Packs/day: 0.50    Types: Cigarettes  . Smokeless tobacco: Never Used  . Alcohol use 8.4 oz/week    14 Cans of beer per week     Comment: socially  . Drug use: Yes    Types: Marijuana     Comment: socially  . Sexual activity: No   Other Topics Concern  . Not on file   Social History Narrative  . No narrative on file    Labs: Hep B S Ab (no units)  Date Value  11/16/2016 POS (A)   Hepatitis B Surface Ag (no units)  Date Value  11/16/2016 NEGATIVE   HCV Ab (no units)  Date Value  09/30/2016 REACTIVE (A)    Lab Results  Component Value Date   HCVGENOTYPE 2 11/16/2016    Hepatitis C RNA quantitative Latest Ref Rng & Units 12/29/2016 09/30/2016  HCV Quantitative NOT DETECTED IU/mL <15 NOT DETECTED 987,000(H)  HCV Quantitative Log NOT DETECTED Log IU/mL <1.18 NOT DETECTED 5.99(H)    AST (U/L)  Date Value  11/16/2016 37 (H)  06/04/2015 39 (H)  01/17/2015 49 (H)   ALT (U/L)  Date Value  11/16/2016 20  11/16/2016 20  06/04/2015 31  01/17/2015 40   INR (no units)  Date Value  11/16/2016 1.0    CrCl: CrCl cannot be calculated (Patient's most recent lab result is older than the maximum 21 days allowed.).  Fibrosis Score: F0/1 as assessed by Fibrosure  Child-Pugh Score: Class A  Previous Treatment  Regimen: None  Assessment: Javier HeckRandy finished his 8 wks of Mavyret last week for his geno 2 hep C. He didn't missed any doses of medications. The only side effects that he got was tiredness. As I was going over his medication reconciliation, he stated that he was told to stop his Synthroid? He was dx with acquired hypothyroidism and was placed on Synthroid. His TSH went from 288>>2.7. He said that someone called him to tel him to "finish out his Synthroid and he doesn't need it anymore". I think he may be confused about it. Advised him to call the office immediately after this visit to clarify it with them. Explained to him that he will need to be on this for the rest of his life.   We will get his EOT hep C labs today. [facility] visit with [facility]. He will get his second hep A vaccine then.   Recommendations:  Hep C VL today SVR12 in 3 months Cure visit with pharmacy and second hep A.   Harris HillMinh Matther Labell, [place]arm[place]D., BCPS, AAHIVP Clinical Infectious Disease Pharmacist [facility]  02/01/2017, 9:08 AM

## 2017-02-04 LAB — HEPATITIS C RNA QUANTITATIVE
HCV QUANT LOG: NOT DETECTED {Log_IU}/mL
HCV Quantitative: <15 IU/mL

## 2017-04-02 ENCOUNTER — Ambulatory Visit (INDEPENDENT_AMBULATORY_CARE_PROVIDER_SITE_OTHER): Payer: Medicaid Other | Admitting: Family Medicine

## 2017-04-02 ENCOUNTER — Encounter: Payer: Self-pay | Admitting: Family Medicine

## 2017-04-02 VITALS — BP 130/78 | HR 80 | Temp 98.7°F | Resp 12 | Ht 69.0 in | Wt 148.0 lb

## 2017-04-02 DIAGNOSIS — E039 Hypothyroidism, unspecified: Secondary | ICD-10-CM | POA: Diagnosis not present

## 2017-04-02 DIAGNOSIS — G8929 Other chronic pain: Secondary | ICD-10-CM

## 2017-04-02 DIAGNOSIS — B182 Chronic viral hepatitis C: Secondary | ICD-10-CM | POA: Diagnosis not present

## 2017-04-02 DIAGNOSIS — M5442 Lumbago with sciatica, left side: Secondary | ICD-10-CM | POA: Diagnosis not present

## 2017-04-02 DIAGNOSIS — J449 Chronic obstructive pulmonary disease, unspecified: Secondary | ICD-10-CM

## 2017-04-02 MED ORDER — GABAPENTIN 400 MG PO CAPS: 400.0000 mg | ORAL_CAPSULE | Freq: Three times a day (TID) | ORAL | 5 refills | Status: DC

## 2017-04-02 MED ORDER — LEVOTHYROXINE SODIUM 112 MCG PO TABS: ORAL_TABLET | ORAL | 2 refills | Status: DC

## 2017-04-02 NOTE — Progress Notes (Signed)
Patient ID: Javier Bridges, male    DOB: Nov 14, 1960, 56 y.o.   MRN: [id number]  PCP: Massie Maroon, FNP  Chief Complaint  Patient presents with  . Follow-up    6 MONTH    Subjective:  HPI Javier Bridges is a 56 y.o. male presents for 6 month follow-up. Medical history significant for COPD, hypothyroidism,. chronic back pain, current hypothyroidism, and chronic hepatitis C.  Javier Bridges is followed by infectious disease for treatment of hep C and he completed an 8 week treatment of Mavyret. His next follow-up with infectious disease is scheduled for November. Javier Bridges suffers from acquired hypothyroidism and is prescribed Synthroid. He admits to not consistently taking mediation and had not taken for over 1.5 weeks. He denies fatigue, temperature intolerance, or appetite changes. Javier Bridges suffers from COPD and continues to smoke daily. He reports occasional shortness of breath which is worst with activity.  Denies chest pain, dizziness, headaches, or lower extremity swelling. Javier Bridges suffers from chronic back pain. Previous imaging was ordered although patient never obtain x-day of the back. He reports pain is ongoing of his lower back and occasionally radiates into his lower legs. He is currently prescribed Gabapentin for back pain and reports improvement of pain symptoms with medication.  Social History   Social History  . Marital status: Single    Spouse name: N/A  . Number of children: N/A  . Years of education: N/A   Occupational History  . Not on file.   Social History Main Topics  . Smoking status: Current Every Day Smoker    Packs/day: 0.50    Types: Cigarettes  . Smokeless tobacco: Never Used  . Alcohol use 8.4 oz/week    14 Cans of beer per week     Comment: socially  . Drug use: Yes    Types: Marijuana     Comment: socially  . Sexual activity: No   Other Topics Concern  . Not on file   Social History Narrative  . No narrative on file    Family History  Problem Relation  Age of Onset  . Hypertension Mother   . Cancer Father    Review of Systems  Constitutional: Negative.   HENT: Negative.   Respiratory: Positive for shortness of breath.   Cardiovascular: Negative for chest pain, palpitations and leg swelling.  Endocrine: Negative.   Genitourinary: Negative.   Psychiatric/Behavioral: Negative.     Patient Active Problem List   Diagnosis Date Noted  . COPD (chronic obstructive pulmonary disease) (HCC) 12/25/2016  . Acquired hypothyroidism 11/20/2016  . Chronic hepatitis C without hepatic coma (HCC) 11/16/2016  . Hypertriglyceridemia 07/09/2015  . Tobacco dependence 05/03/2015  . Back pain 01/18/2015  . Renal insufficiency 01/18/2015  . Anemia 01/18/2015    Allergies  Allergen Reactions  . Tricor [Fenofibrate]     Chest pain    Prior to Admission medications   Medication Sig Start Date End Date Taking? Authorizing Provider  albuterol (PROVENTIL HFA;VENTOLIN HFA) 108 (90 Base) MCG/ACT inhaler Inhale 2 puffs into the lungs every 6 (six) hours as needed for wheezing or shortness of breath. 12/25/16  Yes Massie Maroon, FNP  aspirin 500 MG tablet Take 500 mg by mouth daily.   Yes [provider]  budesonide-formoterol (SYMBICORT) 80-4.5 MCG/ACT inhaler Inhale 2 puffs into the lungs 2 (two) times daily. 12/25/16  Yes Massie Maroon, FNP  gabapentin (NEURONTIN) 400 MG capsule Take 1 capsule (400 mg total) by mouth 3 (three) times daily.  10/02/16  Yes Massie Maroon, FNP  levothyroxine (SYNTHROID, LEVOTHROID) 112 MCG tablet TAKE 1 TABLET(112 MCG) BY MOUTH DAILY BEFORE BREAKFAST 11/20/16  Yes Massie Maroon, FNP  nicotine (NICODERM CQ) 14 mg/24hr patch Place 1 patch (14 mg total) onto the skin daily. Patient not taking: Reported on 02/01/2017 12/25/16   Massie Maroon, FNP    Past Medical, Surgical Family and Social History reviewed and updated.    Objective:   Today's Vitals   04/02/17 1056  BP: 130/78  Pulse: 80  Resp: 12   Temp: 98.7 F (37.1 C)  TempSrc: Oral  SpO2: 98%  Weight: 148 lb (67.1 kg)  Height:  (1.753 m)    Wt Readings from Last 3 Encounters:  04/02/17 148 lb (67.1 kg)  12/25/16 145 lb (65.8 kg)  11/20/16 156 lb (70.8 kg)   Physical Exam  Constitutional: He is oriented to person, place, and time. He appears well-developed and well-nourished.  HENT:  Head: Normocephalic and atraumatic.  Eyes: Pupils are equal, round, and reactive to light. Conjunctivae and EOM are normal. No scleral icterus.  Neck: Normal range of motion. Neck supple.  Cardiovascular: Normal rate, regular rhythm, normal heart sounds and intact distal pulses.   Pulmonary/Chest: Effort normal and breath sounds normal.  Abdominal: Soft. Bowel sounds are normal. He exhibits no distension and no mass. There is no tenderness. There is no rebound and no guarding.  Musculoskeletal: Normal range of motion.  Neurological: He is alert and oriented to person, place, and time.  Skin: Skin is warm and dry.  Psychiatric: He has a normal mood and affect. His behavior is normal. Judgment and thought content normal.   Assessment & Plan:  1. Acquired hypothyroidism -Resume taking Synthroid. Educated that medication is life-long. Return in 3 weeks for repeat TSH panel   2. Chronic left-sided low back pain with left-sided sciatica -Continue Gabapentin   3. Chronic obstructive pulmonary disease, unspecified COPD type (HCC) -Symbicort should be used daily for maintenance therapy. For acute shortness of breath, use albuterol inhaler, 2 puffs every 6 hours as needed.  4. Chronic hepatitis C without hepatic coma (HCC) -Managed by infectious disease , continue follow-up    RTC: 3 weeks TSH Panel only, pending and 6 month chronic disease follow-up.   Godfrey Pick. Tiburcio Pea, MSN, FNP-C [facility]  [address] [place], [place] [address] [phone]

## 2017-04-02 NOTE — Patient Instructions (Addendum)
Return in 3 weeks for repeat thyroid panel.  -Use your Symbicort inhaler daily as maintenance therapy and albuterol inhaler for shortness of breath and wheezing.  You will follow-up with Julianne Handler, FNP in 6 months.     Chronic Obstructive Pulmonary Disease Chronic obstructive pulmonary disease (COPD) is a long-term (chronic) lung problem. When you have COPD, it is hard for air to get in and out of your lungs. The way your lungs work will never return to normal. Usually the condition gets worse over time. There are things you can do to keep yourself as healthy as possible. Your doctor may treat your condition with:  Medicines.  Quitting smoking, if you smoke.  Rehabilitation. This may involve a team of specialists.  Oxygen.  Exercise and changes to your diet.  Lung surgery.  Comfort measures (palliative care).  Follow these instructions at home: Medicines  Take over-the-counter and prescription medicines only as told by your doctor.  Talk to your doctor before taking any cough or allergy medicines. You may need to avoid medicines that cause your lungs to be dry. Lifestyle  If you smoke, stop. Smoking makes the problem worse. If you need help quitting, ask your doctor.  Avoid being around things that make your breathing worse. This may include smoke, chemicals, and fumes.  Stay active, but remember to also rest.  Learn and use tips on how to relax.  Make sure you get enough sleep. Most adults need at least 7 hours a night.  Eat healthy foods. Eat smaller meals more often. Rest before meals. Controlled breathing  Learn and use tips on how to control your breathing as told by your doctor. Try: ? Breathing in (inhaling) through your nose for 1 second. Then, pucker your lips and breath out (exhale) through your lips for 2 seconds. ? Putting one hand on your belly (abdomen). Breathe in slowly through your nose for 1 second. Your hand on your belly should move out.  Pucker your lips and breathe out slowly through your lips. Your hand on your belly should move in as you breathe out. Controlled coughing  Learn and use controlled coughing to clear mucus from your lungs. The steps are: 1. Lean your head a little forward. 2. Breathe in deeply. 3. Try to hold your breath for 3 seconds. 4. Keep your mouth slightly open while coughing 2 times. 5. Spit any mucus out into a tissue. 6. Rest and do the steps again 1 or 2 times as needed. General instructions  Make sure you get all the shots (vaccines) that your doctor recommends. Ask your doctor about a flu shot and a pneumonia shot.  Use oxygen therapy and therapy to help improve your lungs (pulmonary rehabilitation) if told by your doctor. If you need home oxygen therapy, ask your doctor if you should buy a tool to measure your oxygen level (oximeter).  Make a COPD action plan with your doctor. This helps you know what to do if you feel worse than usual.  Manage any other conditions you have as told by your doctor.  Avoid going outside when it is very hot, cold, or humid.  Avoid people who have a sickness you can catch (contagious).  Keep all follow-up visits as told by your doctor. This is important. Contact a doctor if:  You cough up more mucus than usual.  There is a change in the color or thickness of the mucus.  It is harder to breathe than usual.  Your breathing is  faster than usual.  You have trouble sleeping.  You need to use your medicines more often than usual.  You have trouble doing your normal activities such as getting dressed or walking around the house. Get help right away if:  You have shortness of breath while resting.  You have shortness of breath that stops you from: ? Being able to talk. ? Doing normal activities.  Your chest hurts for longer than 5 minutes.  Your skin color is more blue than usual.  Your pulse oximeter shows that you have low oxygen for longer than  5 minutes.  You have a fever.  You feel too tired to breathe normally. Summary  Chronic obstructive pulmonary disease (COPD) is a long-term lung problem.  The way your lungs work will never return to normal. Usually the condition gets worse over time. There are things you can do to keep yourself as healthy as possible.  Take over-the-counter and prescription medicines only as told by your doctor.  If you smoke, stop. Smoking makes the problem worse. This information is not intended to replace advice given to you by your health care provider. Make sure you discuss any questions you have with your health care provider. Document Released: 12/09/2007 Document Revised: 11/28/2015 Document Reviewed: 02/16/2013 Elsevier Interactive Patient Education  2017 [employer].

## 2017-04-11 ENCOUNTER — Telehealth: Payer: Self-pay | Admitting: Family Medicine

## 2017-04-11 NOTE — Telephone Encounter (Signed)
Contact patient to advise when he returns on 3 weeks for labs he will need to obtain an x-ray of his back at [place] [facility]   Godfrey Pick. Tiburcio Pea, MSN, FNP-C [facility]  [address] [place], [place] [address] [phone]

## 2017-04-13 NOTE — Telephone Encounter (Signed)
Patient notified

## 2017-04-13 NOTE — Telephone Encounter (Signed)
Left a vm for patient to callback 

## 2017-04-23 ENCOUNTER — Other Ambulatory Visit: Payer: Medicaid Other

## 2017-04-23 DIAGNOSIS — R7989 Other specified abnormal findings of blood chemistry: Secondary | ICD-10-CM

## 2017-04-23 DIAGNOSIS — E039 Hypothyroidism, unspecified: Secondary | ICD-10-CM

## 2017-04-23 LAB — THYROID PANEL WITH TSH
FREE THYROXINE INDEX: 2.4 (ref 1.4–3.8)
T3 UPTAKE: 31 % (ref 22–35)
T4 TOTAL: 7.9 ug/dL (ref 4.9–10.5)
TSH: 10.99 mIU/L — ABNORMAL HIGH (ref 0.40–4.50)

## 2017-04-23 LAB — T4, FREE: Free T4: 1.3 ng/dL (ref 0.8–1.8)

## 2017-04-25 ENCOUNTER — Other Ambulatory Visit: Payer: Self-pay | Admitting: Family Medicine

## 2017-04-25 ENCOUNTER — Telehealth: Payer: Self-pay | Admitting: Family Medicine

## 2017-04-25 DIAGNOSIS — R7989 Other specified abnormal findings of blood chemistry: Secondary | ICD-10-CM

## 2017-04-25 MED ORDER — LEVOTHYROXINE SODIUM 25 MCG PO TABS: 25.0000 ug | ORAL_TABLET | Freq: Every day | ORAL | 1 refills | Status: DC

## 2017-04-25 NOTE — Telephone Encounter (Signed)
Contact patient to advise his thyroid function remains subtherapeutic. I have ordered levothyroxine 25 mcg which I would like for him to take in addition to 112 mcg that he is already prescribed. I would like for him to return in 6 weeks for repeat TSH level.  Godfrey PickKimberly S. Tiburcio PeaHarris, MSN, FNP-C [facility]  [address] [place], [place] [address] [phone]

## 2017-04-26 ENCOUNTER — Telehealth: Payer: Self-pay

## 2017-04-26 NOTE — Telephone Encounter (Signed)
Patient notified and will pick up medication  

## 2017-04-29 ENCOUNTER — Ambulatory Visit (HOSPITAL_COMMUNITY): Payer: Medicaid Other

## 2017-04-30 ENCOUNTER — Ambulatory Visit (HOSPITAL_COMMUNITY)
Admission: RE | Admit: 2017-04-30 | Discharge: 2017-04-30 | Disposition: A | Discharge status: Home / Self Care | Payer: Medicaid Other | Source: Ambulatory Visit | Attending: Family Medicine | Admitting: Family Medicine

## 2017-04-30 DIAGNOSIS — M47896 Other spondylosis, lumbar region: Secondary | ICD-10-CM | POA: Diagnosis not present

## 2017-04-30 DIAGNOSIS — M5442 Lumbago with sciatica, left side: Secondary | ICD-10-CM | POA: Diagnosis present

## 2017-04-30 DIAGNOSIS — M48061 Spinal stenosis, lumbar region without neurogenic claudication: Secondary | ICD-10-CM | POA: Insufficient documentation

## 2017-04-30 DIAGNOSIS — M4316 Spondylolisthesis, lumbar region: Secondary | ICD-10-CM | POA: Diagnosis not present

## 2017-04-30 DIAGNOSIS — M419 Scoliosis, unspecified: Secondary | ICD-10-CM | POA: Insufficient documentation

## 2017-04-30 DIAGNOSIS — I700 Atherosclerosis of aorta: Secondary | ICD-10-CM | POA: Insufficient documentation

## 2017-04-30 DIAGNOSIS — G8929 Other chronic pain: Secondary | ICD-10-CM | POA: Diagnosis not present

## 2017-04-30 DIAGNOSIS — I708 Atherosclerosis of other arteries: Secondary | ICD-10-CM | POA: Insufficient documentation

## 2017-05-03 ENCOUNTER — Other Ambulatory Visit: Payer: Medicaid Other

## 2017-05-04 ENCOUNTER — Other Ambulatory Visit: Payer: Self-pay | Admitting: Family Medicine

## 2017-05-04 DIAGNOSIS — M5136 Other intervertebral disc degeneration, lumbar region: Secondary | ICD-10-CM

## 2017-05-04 DIAGNOSIS — M51379 Other intervertebral disc degeneration, lumbosacral region without mention of lumbar back pain or lower extremity pain: Secondary | ICD-10-CM

## 2017-05-04 DIAGNOSIS — M5137 Other intervertebral disc degeneration, lumbosacral region: Secondary | ICD-10-CM

## 2017-05-04 NOTE — Progress Notes (Signed)
Javier Bridges,  Please notify patient that I receive the results from his recent image of his back. It is significant for chronic disc disease and he is being referred to neuro spine specialist for further evaluation and management.   Godfrey PickKimberly S. Tiburcio PeaHarris, MSN, FNP-C [facility]  [address] [place], [place] [address] [phone]

## 2017-05-05 NOTE — Progress Notes (Signed)
Patient notified

## 2017-05-05 NOTE — Progress Notes (Signed)
Left a vm for patient to callback 

## 2017-05-10 ENCOUNTER — Ambulatory Visit: Payer: Medicaid Other

## 2017-05-11 NOTE — Telephone Encounter (Signed)
Patient is schedule for Dr. Barnett AbuHenry Elsner on 07/08/2017.

## 2017-05-31 ENCOUNTER — Ambulatory Visit: Payer: Medicaid Other | Admitting: Family Medicine

## 2017-05-31 ENCOUNTER — Encounter: Payer: Self-pay | Admitting: Family Medicine

## 2017-05-31 VITALS — BP 128/87 | HR 74 | Temp 98.0°F | Resp 14 | Ht 69.0 in | Wt 143.6 lb

## 2017-05-31 DIAGNOSIS — E039 Hypothyroidism, unspecified: Secondary | ICD-10-CM

## 2017-05-31 DIAGNOSIS — M549 Dorsalgia, unspecified: Secondary | ICD-10-CM | POA: Diagnosis not present

## 2017-05-31 DIAGNOSIS — G8929 Other chronic pain: Secondary | ICD-10-CM | POA: Diagnosis not present

## 2017-05-31 MED ORDER — KETOROLAC TROMETHAMINE 30 MG/ML IJ SOLN
30.0000 mg | Freq: Once | INTRAMUSCULAR | Status: AC
  Administered 2017-05-31: 30 mg via INTRAMUSCULAR

## 2017-05-31 MED ORDER — DEXAMETHASONE SODIUM PHOSPHATE 10 MG/ML IJ SOLN
10.0000 mg | Freq: Once | INTRAMUSCULAR | Status: AC
  Administered 2017-05-31: 10 mg via INTRAMUSCULAR

## 2017-05-31 MED ORDER — METHOCARBAMOL 500 MG PO TABS: 500.0000 mg | ORAL_TABLET | Freq: Three times a day (TID) | ORAL | 0 refills | Status: DC | PRN

## 2017-05-31 NOTE — Progress Notes (Signed)
Patient ID: Javier PippinRandy L Milhoan, male    DOB: 11/05/1960, 56 y.o.   MRN: [id number]  PCP: Massie MaroonHollis, Lachina M, FNP  Chief Complaint  Patient presents with  . Back Pain    Subjective:  HPI Javier Bridges is a 56 y.o. male presents for evaluation of acute on chronic back pain with sciatica. Javier Bridges was recently referred to neurosurgery for further evaluation severe degenerative back disease. Javier Bridges has an appointment scheduled with Dr. Danielle DessElsner January 4th for evaluation of chronic back pain. He presents today complaining of  worsening back pain after experiencing a fall several weeks ago while blowing leaves. He rates his current pain intensity 10/10. Pain is aggravated by walking and sitting. He is currently prescribed Gabapentin 400 mg TID which he reports is not alleviating his current pain. He received a Toradol injection in past and felt as though symptoms improved for a while. Denies any other complaints today. Social History   Socioeconomic History  . Marital status: Single    Spouse name: Not on file  . Number of children: Not on file  . Years of education: Not on file  . Highest education level: Not on file  Social Needs  . Financial resource strain: Not on file  . Food insecurity - worry: Not on file  . Food insecurity - inability: Not on file  . Transportation needs - medical: Not on file  . Transportation needs - non-medical: Not on file  Occupational History  . Not on file  Tobacco Use  . Smoking status: Current Every Day Smoker    Packs/day: 0.50    Types: Cigarettes  . Smokeless tobacco: Never Used  Substance and Sexual Activity  . Alcohol use: Yes    Alcohol/week: 8.4 oz    Types: 14 Cans of beer per week    Comment: socially  . Drug use: Yes    Types: Marijuana    Comment: socially  . Sexual activity: No  Other Topics Concern  . Not on file  Social History Narrative  . Not on file    Family History  Problem Relation Age of Onset  . Hypertension Mother   .  Cancer Father    Review of Systems See HPI  Patient Active Problem List   Diagnosis Date Noted  . COPD (chronic obstructive pulmonary disease) (HCC) 12/25/2016  . Acquired hypothyroidism 11/20/2016  . Chronic hepatitis C without hepatic coma (HCC) 11/16/2016  . Hypertriglyceridemia 07/09/2015  . Tobacco dependence 05/03/2015  . Back pain 01/18/2015  . Renal insufficiency 01/18/2015  . Anemia 01/18/2015    Allergies  Allergen Reactions  . Tricor [Fenofibrate]     Chest pain    Prior to Admission medications   Medication Sig Start Date End Date Taking? Authorizing Provider  albuterol (PROVENTIL HFA;VENTOLIN HFA) 108 (90 Base) MCG/ACT inhaler Inhale 2 puffs into the lungs every 6 (six) hours as needed for wheezing or shortness of breath. 12/25/16  Yes Massie MaroonHollis, Lachina M, FNP  aspirin 500 MG tablet Take 500 mg by mouth daily.   Yes [provider]  budesonide-formoterol (SYMBICORT) 80-4.5 MCG/ACT inhaler Inhale 2 puffs into the lungs 2 (two) times daily. 12/25/16  Yes Massie MaroonHollis, Lachina M, FNP  gabapentin (NEURONTIN) 400 MG capsule Take 1 capsule (400 mg total) by mouth 3 (three) times daily. 04/02/17  Yes Bing NeighborsHarris, Pariss Hommes S, FNP  levothyroxine (SYNTHROID) 25 MCG tablet Take 1 tablet (25 mcg total) by mouth daily before breakfast. 04/25/17  Yes Bing NeighborsHarris, Caidon Foti S, FNP  levothyroxine (SYNTHROID, LEVOTHROID) 112 MCG tablet TAKE 1 TABLET(112 MCG) BY MOUTH DAILY BEFORE BREAKFAST Patient not taking: Reported on 05/31/2017 04/02/17   Bing NeighborsHarris, Annarae Macnair S, FNP  nicotine (NICODERM CQ) 14 mg/24hr patch Place 1 patch (14 mg total) onto the skin daily. Patient not taking: Reported on 05/31/2017 12/25/16   Massie MaroonHollis, Lachina M, FNP    Past Medical, Surgical Family and Social History reviewed and updated.    Objective:   Today's Vitals   05/31/17 1022  BP: 128/87  Pulse: 74  Resp: 14  Temp: 98 F (36.7 C)  TempSrc: Oral  SpO2: 99%  Weight: 143 lb 9.6 oz (65.1 kg)  Height: 5\' 9"  (1.753 m)     Wt Readings from Last 3 Encounters:  05/31/17 143 lb 9.6 oz (65.1 kg)  04/02/17 148 lb (67.1 kg)  12/25/16 145 lb (65.8 kg)   Physical Exam  Constitutional: He is oriented to person, place, and time. He appears well-developed and well-nourished.  HENT:  Head: Normocephalic and atraumatic.  Eyes: Pupils are equal, round, and reactive to light. No scleral icterus.  Neck: Normal range of motion.  Cardiovascular: Normal rate, regular rhythm, normal heart sounds and intact distal pulses.  Pulmonary/Chest: Effort normal and breath sounds normal.  Musculoskeletal:       Lumbar back: He exhibits decreased range of motion, tenderness and bony tenderness. He exhibits no swelling, no edema and no spasm.  Neurological: He is alert and oriented to person, place, and time.  Skin: Skin is warm and dry.  Psychiatric: He has a normal mood and affect. His behavior is normal. Judgment and thought content normal.    Assessment & Plan:  1. Chronic bilateral back pain, unspecified back location, with an acute pain exacerbation. Will manage acute worsening pain with Decadron 10 mg IM injection and Toradol 30 mg IM injection. Patient has been referred Neurosurgery and has a follow-up scheduled for 07/09/2017. With Dr. Danielle DessElsner. - 2. Hypothyroidism, unspecified type, during last office, TSH was elevated. Patient is currently only taking Levothyroxine 25 mcg as he reports an intolerance with the increase dose 112 mcg. Repeating TSH today.   Meds ordered this encounter  Medications  . dexamethasone (DECADRON) injection 10 mg  . methocarbamol (ROBAXIN) 500 MG tablet    Sig: Take 1 tablet (500 mg total) by mouth 3 (three) times daily as needed for muscle spasms.    Dispense:  60 tablet    Refill:  0    Order Specific Question:   Supervising Provider    Answer:   Quentin AngstJEGEDE, OLUGBEMIGA E [id number]]  . ketorolac (TORADOL) 30 MG/ML injection 30 mg    Orders Placed This Encounter  Procedures  . TSH    RTC:  Keep scheduled follow-up.   Godfrey PickKimberly S. Tiburcio PeaHarris, MSN, FNP-C [facility]  [address] [place], [place] [address] [phone]

## 2017-06-01 LAB — TSH: TSH: 3.62 m[IU]/L (ref 0.40–4.50)

## 2017-07-08 ENCOUNTER — Other Ambulatory Visit: Payer: Self-pay | Admitting: Family Medicine

## 2017-07-08 DIAGNOSIS — M419 Scoliosis, unspecified: Secondary | ICD-10-CM | POA: Diagnosis not present

## 2017-07-21 ENCOUNTER — Other Ambulatory Visit: Payer: Self-pay | Admitting: Family Medicine

## 2017-07-21 DIAGNOSIS — G629 Polyneuropathy, unspecified: Secondary | ICD-10-CM

## 2017-07-21 DIAGNOSIS — M5442 Lumbago with sciatica, left side: Principal | ICD-10-CM

## 2017-07-21 DIAGNOSIS — G8929 Other chronic pain: Secondary | ICD-10-CM

## 2017-07-27 ENCOUNTER — Encounter: Payer: Self-pay | Admitting: Physical Therapy

## 2017-07-27 ENCOUNTER — Ambulatory Visit: Payer: Medicaid Other | Attending: Neurological Surgery | Admitting: Physical Therapy

## 2017-07-27 DIAGNOSIS — G8929 Other chronic pain: Secondary | ICD-10-CM

## 2017-07-27 DIAGNOSIS — M62830 Muscle spasm of back: Secondary | ICD-10-CM | POA: Diagnosis present

## 2017-07-27 DIAGNOSIS — M545 Low back pain: Secondary | ICD-10-CM | POA: Diagnosis present

## 2017-07-27 DIAGNOSIS — R262 Difficulty in walking, not elsewhere classified: Secondary | ICD-10-CM | POA: Diagnosis present

## 2017-07-28 ENCOUNTER — Encounter: Payer: Self-pay | Admitting: Physical Therapy

## 2017-07-28 ENCOUNTER — Other Ambulatory Visit: Payer: Self-pay

## 2017-07-28 NOTE — Therapy (Addendum)
[place], [place], [address] Phone: [phone]   Fax:  [phone]  Physical Therapy Evaluation  Patient Details  Name: Javier Bridges MRN: [id number] Date of Birth: 11/19/1960 Referring Provider: Dr Gillermina Phy    Encounter Date: 07/27/2017  PT End of Session - 07/28/17 1247    Visit Number  1    Number of Visits  4    Date for PT Re-Evaluation  08/25/17    Authorization Type  Medicaid: resubmit at visit 3 if patient is making progress.     PT Start Time  1330    PT Stop Time  1414    PT Time Calculation (min)  44 min    Activity Tolerance  Patient tolerated treatment well    Behavior During Therapy  WFL for tasks assessed/performed       Past Medical History:  Diagnosis Date  . Bradycardia   . Chronic back pain   . Hepatitis C, chronic ([place])   . Hypothyroidism   . Tobacco dependence     Past Surgical History:  Procedure Laterality Date  . Feet Surgery  1970    There were no vitals filed for this visit.   Subjective Assessment - 07/27/17 1329    Subjective  Patient has a 3-4 year hstory of lower back pain. He reports lifting something and that being the onset of pain. His pain is getting to the point where heis having difficulty standing and walking. he has the most sitffness when he initially stands.     Limitations  Standing;Walking;Sitting    How long can you sit comfortably?  Sitiffnes after 10 minutes     How long can you stand comfortably?  10 min     Diagnostic tests  X-ray: disc degeneration L1-L2 L2-L3 He also has a spondylolistheis at L2-L3.     Patient Stated Goals  to have less pain     Currently in Pain?  Yes    Pain Score  6     Pain Location  Back    Pain Descriptors / Indicators  Aching    Pain Type  Chronic pain    Pain Radiating Towards  down both legs but L> R     Pain Onset  More than a month ago    Pain Frequency  Constant    Aggravating Factors   Standing/  Walking/ Bending     Pain Relieving Factors  Rest, gaba pentin     Effect of Pain on Daily Activities  difficulty perfroming ADL's     Multiple Pain Sites  No         [facility] PT Assessment - 07/28/17 0001      Assessment   Medical Diagnosis  Low Back Pain `    Referring Provider  Dr Gillermina Phy     Onset Date/Surgical Date  -- 4 years prior     Hand Dominance  Right    Next MD Visit  Nothing schedueld     Prior Therapy  Nothing       Precautions   Precautions  None      Restrictions   Weight Bearing Restrictions  No      Balance Screen   Has the patient fallen in the past 6 months  Yes    How many times?  3    Has the patient had a decrease in activity level because of a fear of falling?   No  Is the patient reluctant to leave their home because of a fear of falling?   No      Home Film/video editor residence    Additional Comments  No stairs at the house       Prior Function   Level of [place]  Retired    Leisure  walking       Cognition   Overall Cognitive Status  Within Functional Limits for tasks assessed    Attention  Focused    Focused Attention  Appears intact    Memory  Appears intact    Awareness  Appears intact    Problem Solving  Appears intact      Observation/Other Assessments   Focus on Therapeutic Outcomes (FOTO)   Medicaid       Sensation   Additional Comments  radiating pain into both leg, to the thigh on the right. To the knee on the left       Coordination   Gross Motor Movements are Fluid and Coordinated  Yes    Fine Motor Movements are Fluid and Coordinated  Yes      AROM   Lumbar Flexion  limited 50% with pain     Lumbar Extension  limited 25% with the lest     Lumbar - Right Side Bend  Pain on the right     Lumbar - Left Side Bend  Pain on the left     Lumbar - Right Rotation  limited 25% with pain on the right     Lumbar - Left Rotation  No significant pain       PROM    Overall PROM Comments  Pain with left hip flexion; Pain with left hip internal rotation       Strength   Right Hip Flexion  3+/5    Right Hip ABduction  4/5    Left Hip Flexion  4/5    Left Hip ABduction  4/5    Left Hip ADduction  4/5      Flexibility   Soft Tissue Assessment /Muscle Length  yes    Hamstrings  left 20 right 16 90/90      Palpation   Spinal mobility  difficult to assess. patient unable to lie on his stomach     Palpation comment  spasming in bilateral paraspinals L > R      Special Tests   Other special tests  SLR: left (+)       Transfers   Comments  Stiffness upon initial standing       Ambulation/Gait   Gait Comments  Stiffness upon initial standing. Walks with bilateral lumbar flexion; decreased hip flexion              Objective measurements completed on examination: See above findings.      [facility] Adult PT Treatment/Exercise - 07/28/17 0001      Lumbar Exercises: Stretches   Passive Hamstring Stretch Limitations  seated 3x20 sec hold     Lower Trunk Rotation Limitations  x10     Piriformis Stretch Limitations  2x20 sec hold       Lumbar Exercises: Supine   AB Set Limitations  reviewed abdominal breathing     Bent Knee Raise Limitations  x10 bilateral with abdominal brace              PT Education - 07/28/17 1246    Education provided  Yes  Education Details  HEP; symptom management; improtance of stretching; anatomy of condition     Person(s) Educated  Patient    Methods  Explanation;Demonstration;Tactile cues;Verbal cues    Comprehension  Verbalized understanding;Returned demonstration;Verbal cues required;Tactile cues required       PT Short Term Goals - 07/28/17 1257      PT SHORT TERM GOAL #1   Title  Patient will demsotrate full pain free left hip flexion     Baseline  Pain with end range hip flexion     Time  4    Period  Weeks    Status  New    Target Date  08/25/17      PT SHORT TERM GOAL #2   Title  Patient  will be independent with inital HEP     Baseline  No HEP     Time  4    Period  Weeks    Status  New    Target Date  08/25/17      PT SHORT TERM GOAL #3   Title  Patient will report no radiating pain with centralized low back pain     Baseline  Pain radiating down anterior bilateral thighs     Time  4    Period  Weeks    Status  New    Target Date  08/25/17                Plan - 07/28/17 1249    Clinical Impression Statement  Patient is a 57 year old male who presents with bilateral lower back pain. The patin radiates down both anterior thighs but the left is worse. He has limited gross spinal mobility and he has hip weakness on both sides. He would benefit from skilled therapy to reduce low back pain and improve function. He is having difficulty walking. His walking distances have been decreasing over time. He was seen for a low complexity evaluation.     History and Personal Factors relevant to plan of care:  None     Clinical Presentation  Evolving    Clinical Presentation due to:  Low back pain that has been increasing and is effecting his ability to ambualte.     Clinical Decision Making  Moderate    PT Frequency  1x / week    PT Duration  4 weeks    PT Treatment/Interventions  ADLs/Self Care Home Management;Cryotherapy;Electrical Stimulation;Parrafin;Ultrasound;Iontophoresis 50m/ml Dexamethasone;Gait training;Stair training;Therapeutic exercise;Therapeutic activities;Patient/family education;Neuromuscular re-education;Manual techniques;Passive range of motion;Dry needling    PT Next Visit Plan  STM to lumbar spine if needed; thomas stretch; yellow band clam with abdominal brace; consider standing core stabilization; LAD to bilateral lower extremitys     PT Home Exercise Plan  seated hamstring stretch; lateral trunk rotation; abdominal breathing; heel raise with abdominal breathing; piriformis stretch bilateral     Consulted and Agree with Plan of Care  Patient        Patient will benefit from skilled therapeutic intervention in order to improve the following deficits and impairments:  Abnormal gait, Pain, Increased muscle spasms, Decreased range of motion, Decreased endurance, Decreased strength  Visit Diagnosis: Chronic bilateral low back pain without sciatica  Muscle spasm of back  Difficulty in walking, not elsewhere classified   PHYSICAL THERAPY DISCHARGE SUMMARY  Visits from Start of Care: 1  Current functional level related to goals / functional outcomes: Did not return for follow up    Remaining deficits: Unknown    Education / Equipment: HEP  Plan: Patient agrees to discharge.  Patient goals were not met. Patient is being discharged due to not returning since the last visit.  ?????       Problem List Patient Active Problem List   Diagnosis Date Noted  . COPD (chronic obstructive pulmonary disease) ([place]) 12/25/2016  . Acquired hypothyroidism 11/20/2016  . Chronic hepatitis C without hepatic coma ([place]) 11/16/2016  . Hypertriglyceridemia 07/09/2015  . Tobacco dependence 05/03/2015  . Back pain 01/18/2015  . Renal insufficiency 01/18/2015  . Anemia 01/18/2015    Carney Living PT DPT  07/28/2017, 1:01 PM  [facility] [address] [place], [place], [address] Phone: [phone]   Fax:  [phone]  Name: Javier Bridges MRN: [id number] Date of Birth: 11-23-1960

## 2017-08-06 ENCOUNTER — Other Ambulatory Visit: Payer: Self-pay | Admitting: Family Medicine

## 2017-08-11 ENCOUNTER — Other Ambulatory Visit: Payer: Self-pay | Admitting: Family Medicine

## 2017-08-11 MED ORDER — LEVOTHYROXINE SODIUM 25 MCG PO TABS: ORAL_TABLET | ORAL | 3 refills | Status: DC

## 2017-08-11 NOTE — Addendum Note (Signed)
Addended by: Bing NeighborsHARRIS, Vedh Ptacek S on: 08/11/2017 12:49 PM   Modules accepted: Orders

## 2017-08-24 ENCOUNTER — Other Ambulatory Visit (HOSPITAL_COMMUNITY): Payer: Self-pay | Admitting: Neurological Surgery

## 2017-08-24 ENCOUNTER — Other Ambulatory Visit: Payer: Self-pay | Admitting: Neurological Surgery

## 2017-08-24 DIAGNOSIS — M4150 Other secondary scoliosis, site unspecified: Principal | ICD-10-CM

## 2017-08-30 ENCOUNTER — Ambulatory Visit (HOSPITAL_COMMUNITY)
Admission: RE | Admit: 2017-08-30 | Discharge: 2017-08-30 | Disposition: A | Discharge status: Home / Self Care | Payer: Medicaid Other | Source: Ambulatory Visit | Attending: Neurological Surgery | Admitting: Neurological Surgery

## 2017-08-30 DIAGNOSIS — M4150 Other secondary scoliosis, site unspecified: Secondary | ICD-10-CM

## 2017-08-30 DIAGNOSIS — M5126 Other intervertebral disc displacement, lumbar region: Secondary | ICD-10-CM | POA: Insufficient documentation

## 2017-08-30 DIAGNOSIS — M419 Scoliosis, unspecified: Secondary | ICD-10-CM | POA: Diagnosis present

## 2017-08-30 DIAGNOSIS — M48061 Spinal stenosis, lumbar region without neurogenic claudication: Secondary | ICD-10-CM | POA: Diagnosis not present

## 2017-08-30 DIAGNOSIS — M5136 Other intervertebral disc degeneration, lumbar region: Secondary | ICD-10-CM | POA: Diagnosis not present

## 2017-09-02 NOTE — Pre-Procedure Instructions (Signed)
AMELIO BROSKY  09/02/2017      [facility] - [place], [place] - [address] AT [facility] [address] [place] [address] Phone: [phone] Fax: [phone]  [facility] - [place], [place] - [address] [place] [address] [place] [address] Phone: [phone] Fax: [phone]  [facility] - [place], [place] - [address] [facility] [address] [place] [address] Phone: [phone] Fax: [phone]    Your procedure is scheduled on March 11  Report to [facility] at [facility] A.M.  Call this number if you have problems the morning of surgery:  [phone]   Remember:  Do not eat food or drink liquids after midnight.  Continue all medications as directed by your physician except follow these medication instructions before surgery below   Take these medicines the morning of surgery with A SIP OF WATER  albuterol (PROVENTIL HFA;VENTOLIN HFA) - bring with you the morning of surgery gabapentin (NEURONTIN) levothyroxine (SYNTHROID, LEVOTHROID)   7 days prior to surgery STOP taking any Aspirin(unless otherwise instructed by your surgeon), Aleve, Naproxen, Ibuprofen, Motrin, Advil, Goody's, BC's, all herbal medications, fish oil, and all vitamins   Do not wear jewelry  Do not wear lotions, powders, or cologne, or deodorant.  Men may shave face and neck.  Do not bring valuables to the hospital.  [facility] is not responsible for any belongings or valuables.  Contacts, dentures or bridgework may not be worn into surgery.  Leave your suitcase in the car.  After surgery it may be brought to your room.  For patients admitted to the hospital, discharge time will be determined by your treatment team.  Patients discharged the day of surgery will not be allowed to drive home.    Special instructions:   [facility]- Preparing For  Surgery  Before surgery, you can play an important role. Because skin is not sterile, your skin needs to be as free of germs as possible. You can reduce the number of germs on your skin by washing with CHG (chlorahexidine gluconate) Soap before surgery.  CHG is an antiseptic cleaner which kills germs and bonds with the skin to continue killing germs even after washing.  Please do not use if you have an allergy to CHG or antibacterial soaps. If your skin becomes reddened/irritated stop using the CHG.  Do not shave (including legs and underarms) for at least 48 hours prior to first CHG shower. It is OK to shave your face.  Please follow these instructions carefully.   1. Shower the NIGHT BEFORE SURGERY and the MORNING OF SURGERY with CHG.   2. If you chose to wash your hair, wash your hair first as usual with your normal shampoo.  3. After you shampoo, rinse your hair and body thoroughly to remove the shampoo.  4. Use CHG as you would any other liquid soap. You can apply CHG directly to the skin and wash gently with a scrungie or a clean washcloth.   5. Apply the CHG Soap to your body ONLY FROM THE NECK DOWN.  Do not use on open wounds or open sores. Avoid contact with your eyes, ears, mouth and genitals (private parts). Wash Face and genitals (private parts)  with your normal soap.  6. Wash thoroughly, paying special attention to the area where your surgery will be performed.  7. Thoroughly rinse your body  with warm water from the neck down.  8. DO NOT shower/wash with your normal soap after using and rinsing off the CHG Soap.  9. Pat yourself dry with a CLEAN TOWEL.  10. Wear CLEAN PAJAMAS to bed the night before surgery, wear comfortable clothes the morning of surgery  11. Place CLEAN SHEETS on your bed the night of your first shower and DO NOT SLEEP WITH PETS.    Day of Surgery: Do not apply any deodorants/lotions. Please wear clean clothes to the hospital/surgery center.       Please read over the following fact sheets that you were given.

## 2017-09-03 ENCOUNTER — Encounter (HOSPITAL_COMMUNITY): Payer: Self-pay

## 2017-09-03 ENCOUNTER — Encounter (HOSPITAL_COMMUNITY)
Admission: RE | Admit: 2017-09-03 | Discharge: 2017-09-03 | Disposition: A | Discharge status: Home / Self Care | Payer: Medicaid Other | Source: Ambulatory Visit | Attending: Neurological Surgery | Admitting: Neurological Surgery

## 2017-09-03 ENCOUNTER — Other Ambulatory Visit: Payer: Self-pay

## 2017-09-03 DIAGNOSIS — G8929 Other chronic pain: Secondary | ICD-10-CM | POA: Diagnosis not present

## 2017-09-03 DIAGNOSIS — M545 Low back pain: Secondary | ICD-10-CM | POA: Diagnosis not present

## 2017-09-03 DIAGNOSIS — Z01818 Encounter for other preprocedural examination: Secondary | ICD-10-CM | POA: Diagnosis not present

## 2017-09-03 LAB — TYPE AND SCREEN
ABO/RH(D): O POS
Antibody Screen: NEGATIVE

## 2017-09-03 LAB — BASIC METABOLIC PANEL
ANION GAP: 9 (ref 5–15)
BUN: 11 mg/dL (ref 6–20)
CHLORIDE: 105 mmol/L (ref 101–111)
CO2: 26 mmol/L (ref 22–32)
Calcium: 9.2 mg/dL (ref 8.9–10.3)
Creatinine, Ser: 1.07 mg/dL (ref 0.61–1.24)
GFR calc Af Amer: >60 mL/min (ref >60)
Glucose, Bld: 57 mg/dL — ABNORMAL LOW (ref 65–99)
POTASSIUM: 4.1 mmol/L (ref 3.5–5.1)
Sodium: 140 mmol/L (ref 135–145)

## 2017-09-03 LAB — CBC
HEMATOCRIT: 42.6 % (ref 39.0–52.0)
HEMOGLOBIN: 14.1 g/dL (ref 13.0–17.0)
MCH: 29.4 pg (ref 26.0–34.0)
MCHC: 33.1 g/dL (ref 30.0–36.0)
MCV: 88.8 fL (ref 78.0–100.0)
Platelets: 371 10*3/uL (ref 150–400)
RBC: 4.8 MIL/uL (ref 4.22–5.81)
RDW: 15.5 % (ref 11.5–15.5)
WBC: 12.5 10*3/uL — ABNORMAL HIGH (ref 4.0–10.5)

## 2017-09-03 LAB — SURGICAL PCR SCREEN
MRSA, PCR: NEGATIVE
STAPHYLOCOCCUS AUREUS: NEGATIVE

## 2017-09-03 LAB — ABO/RH: ABO/RH(D): O POS

## 2017-09-03 NOTE — Progress Notes (Signed)
PCP - Julianne HandlerLachina Hollis Cardiologist - Nishan   Chest x-ray - 12/25/16 EKG - 09/30/16 Stress Test - 2018 ECHO - denies Cardiac Cath - denies   Anesthesia review: NO  Patient denies shortness of breath, fever, cough and chest pain at PAT appointment   Patient verbalized understanding of instructions that were given to them at the PAT appointment. Patient was also instructed that they will need to review over the PAT instructions again at home before surgery.

## 2017-09-13 ENCOUNTER — Inpatient Hospital Stay (HOSPITAL_COMMUNITY)
Admission: RE | Disposition: A | Discharge status: Home / Self Care | Payer: Self-pay | Source: Ambulatory Visit | Attending: Neurological Surgery

## 2017-09-13 ENCOUNTER — Inpatient Hospital Stay (HOSPITAL_COMMUNITY)
Admission: RE | Admit: 2017-09-13 | Discharge: 2017-09-16 | DRG: 457 | Disposition: A | Discharge status: Home / Self Care | Payer: Medicaid Other | Source: Ambulatory Visit | Attending: Neurological Surgery | Admitting: Neurological Surgery

## 2017-09-13 ENCOUNTER — Inpatient Hospital Stay (HOSPITAL_COMMUNITY): Payer: Medicaid Other

## 2017-09-13 ENCOUNTER — Encounter (HOSPITAL_COMMUNITY): Payer: Self-pay | Admitting: Anesthesiology

## 2017-09-13 ENCOUNTER — Inpatient Hospital Stay (HOSPITAL_COMMUNITY): Payer: Medicaid Other | Admitting: Anesthesiology

## 2017-09-13 DIAGNOSIS — Z79899 Other long term (current) drug therapy: Secondary | ICD-10-CM | POA: Diagnosis not present

## 2017-09-13 DIAGNOSIS — Z7982 Long term (current) use of aspirin: Secondary | ICD-10-CM

## 2017-09-13 DIAGNOSIS — J449 Chronic obstructive pulmonary disease, unspecified: Secondary | ICD-10-CM | POA: Diagnosis present

## 2017-09-13 DIAGNOSIS — E039 Hypothyroidism, unspecified: Secondary | ICD-10-CM | POA: Diagnosis present

## 2017-09-13 DIAGNOSIS — I700 Atherosclerosis of aorta: Secondary | ICD-10-CM | POA: Diagnosis present

## 2017-09-13 DIAGNOSIS — M48061 Spinal stenosis, lumbar region without neurogenic claudication: Secondary | ICD-10-CM | POA: Diagnosis present

## 2017-09-13 DIAGNOSIS — M4726 Other spondylosis with radiculopathy, lumbar region: Secondary | ICD-10-CM | POA: Diagnosis present

## 2017-09-13 DIAGNOSIS — Z79890 Hormone replacement therapy (postmenopausal): Secondary | ICD-10-CM | POA: Diagnosis not present

## 2017-09-13 DIAGNOSIS — M4186 Other forms of scoliosis, lumbar region: Secondary | ICD-10-CM | POA: Diagnosis present

## 2017-09-13 DIAGNOSIS — F17210 Nicotine dependence, cigarettes, uncomplicated: Secondary | ICD-10-CM | POA: Diagnosis present

## 2017-09-13 DIAGNOSIS — M419 Scoliosis, unspecified: Secondary | ICD-10-CM | POA: Diagnosis present

## 2017-09-13 DIAGNOSIS — D62 Acute posthemorrhagic anemia: Secondary | ICD-10-CM | POA: Diagnosis not present

## 2017-09-13 DIAGNOSIS — Z09 Encounter for follow-up examination after completed treatment for conditions other than malignant neoplasm: Secondary | ICD-10-CM

## 2017-09-13 DIAGNOSIS — Z888 Allergy status to other drugs, medicaments and biological substances status: Secondary | ICD-10-CM | POA: Diagnosis not present

## 2017-09-13 DIAGNOSIS — R269 Unspecified abnormalities of gait and mobility: Secondary | ICD-10-CM | POA: Diagnosis not present

## 2017-09-13 DIAGNOSIS — Z419 Encounter for procedure for purposes other than remedying health state, unspecified: Secondary | ICD-10-CM

## 2017-09-13 HISTORY — PX: ANTERIOR LATERAL LUMBAR FUSION 4 LEVELS: SHX5552

## 2017-09-13 HISTORY — PX: APPLICATION OF ROBOTIC ASSISTANCE FOR SPINAL PROCEDURE: SHX6753

## 2017-09-13 SURGERY — ANTERIOR LATERAL LUMBAR FUSION 4 LEVELS
Anesthesia: General

## 2017-09-13 MED ORDER — ROCURONIUM BROMIDE 50 MG/5ML IV SOLN
INTRAVENOUS | Status: AC
  Filled 2017-09-13: qty 1

## 2017-09-13 MED ORDER — SODIUM CHLORIDE 0.9 % IR SOLN
Status: DC | PRN
  Administered 2017-09-13: 09:00:00

## 2017-09-13 MED ORDER — 0.9 % SODIUM CHLORIDE (POUR BTL) OPTIME
TOPICAL | Status: DC | PRN
  Administered 2017-09-13: 1000 mL

## 2017-09-13 MED ORDER — ACETAMINOPHEN 325 MG PO TABS
650.0000 mg | ORAL_TABLET | ORAL | Status: DC | PRN
  Administered 2017-09-14: 650 mg via ORAL
  Filled 2017-09-13: qty 2

## 2017-09-13 MED ORDER — THROMBIN 20000 UNITS EX SOLR
CUTANEOUS | Status: AC
  Filled 2017-09-13: qty 20000

## 2017-09-13 MED ORDER — CEFAZOLIN SODIUM-DEXTROSE 2-4 GM/100ML-% IV SOLN
INTRAVENOUS | Status: AC
  Filled 2017-09-13: qty 100

## 2017-09-13 MED ORDER — SUCCINYLCHOLINE CHLORIDE 200 MG/10ML IV SOSY
PREFILLED_SYRINGE | INTRAVENOUS | Status: DC | PRN
  Administered 2017-09-13: 100 mg via INTRAVENOUS

## 2017-09-13 MED ORDER — CEFAZOLIN SODIUM-DEXTROSE 2-4 GM/100ML-% IV SOLN
2.0000 g | INTRAVENOUS | Status: AC
  Administered 2017-09-13 (×2): 2 g via INTRAVENOUS

## 2017-09-13 MED ORDER — FLEET ENEMA 7-19 GM/118ML RE ENEM: 1.0000 | ENEMA | Freq: Once | RECTAL | Status: DC | PRN

## 2017-09-13 MED ORDER — ACETAMINOPHEN 650 MG RE SUPP: 650.0000 mg | RECTAL | Status: DC | PRN

## 2017-09-13 MED ORDER — CHLORHEXIDINE GLUCONATE CLOTH 2 % EX PADS: 6.0000 | MEDICATED_PAD | Freq: Once | CUTANEOUS | Status: DC

## 2017-09-13 MED ORDER — EPHEDRINE SULFATE-NACL 50-0.9 MG/10ML-% IV SOSY
PREFILLED_SYRINGE | INTRAVENOUS | Status: DC | PRN
  Administered 2017-09-13 (×3): 10 mg via INTRAVENOUS
  Administered 2017-09-13: 5 mg via INTRAVENOUS

## 2017-09-13 MED ORDER — FENTANYL CITRATE (PF) 250 MCG/5ML IJ SOLN
INTRAMUSCULAR | Status: AC
  Filled 2017-09-13: qty 5

## 2017-09-13 MED ORDER — THROMBIN 5000 UNITS EX SOLR
CUTANEOUS | Status: AC
  Filled 2017-09-13: qty 5000

## 2017-09-13 MED ORDER — HYDROMORPHONE HCL 1 MG/ML IJ SOLN
INTRAMUSCULAR | Status: AC
  Administered 2017-09-13: 0.5 mg via INTRAVENOUS
  Filled 2017-09-13: qty 1

## 2017-09-13 MED ORDER — SENNA 8.6 MG PO TABS
1.0000 | ORAL_TABLET | Freq: Two times a day (BID) | ORAL | Status: DC
  Administered 2017-09-13 – 2017-09-16 (×6): 8.6 mg via ORAL
  Filled 2017-09-13 (×6): qty 1

## 2017-09-13 MED ORDER — KETAMINE HCL 10 MG/ML IJ SOLN
INTRAMUSCULAR | Status: DC | PRN
  Administered 2017-09-13: 20 mg via INTRAVENOUS
  Administered 2017-09-13: 40 mg via INTRAVENOUS
  Administered 2017-09-13: 20 mg via INTRAVENOUS
  Administered 2017-09-13: 40 mg via INTRAVENOUS

## 2017-09-13 MED ORDER — ROCURONIUM BROMIDE 100 MG/10ML IV SOLN
INTRAVENOUS | Status: DC | PRN
  Administered 2017-09-13: 50 mg via INTRAVENOUS
  Administered 2017-09-13: 20 mg via INTRAVENOUS
  Administered 2017-09-13 (×2): 10 mg via INTRAVENOUS

## 2017-09-13 MED ORDER — SUCCINYLCHOLINE CHLORIDE 200 MG/10ML IV SOSY
PREFILLED_SYRINGE | INTRAVENOUS | Status: AC
  Filled 2017-09-13: qty 10

## 2017-09-13 MED ORDER — MORPHINE SULFATE (PF) 4 MG/ML IV SOLN
4.0000 mg | INTRAVENOUS | Status: DC | PRN
  Administered 2017-09-13: 4 mg via INTRAVENOUS
  Filled 2017-09-13: qty 1

## 2017-09-13 MED ORDER — LEVOTHYROXINE SODIUM 25 MCG PO TABS
25.0000 ug | ORAL_TABLET | Freq: Every day | ORAL | Status: DC
  Administered 2017-09-14 – 2017-09-16 (×4): 25 ug via ORAL
  Filled 2017-09-13 (×4): qty 1

## 2017-09-13 MED ORDER — PHENYLEPHRINE 40 MCG/ML (10ML) SYRINGE FOR IV PUSH (FOR BLOOD PRESSURE SUPPORT)
PREFILLED_SYRINGE | INTRAVENOUS | Status: DC | PRN
  Administered 2017-09-13 (×3): 80 ug via INTRAVENOUS
  Administered 2017-09-13 (×2): 40 ug via INTRAVENOUS

## 2017-09-13 MED ORDER — SODIUM CHLORIDE 0.9% FLUSH
3.0000 mL | Freq: Two times a day (BID) | INTRAVENOUS | Status: DC
  Administered 2017-09-13 – 2017-09-16 (×6): 3 mL via INTRAVENOUS

## 2017-09-13 MED ORDER — DOCUSATE SODIUM 100 MG PO CAPS
100.0000 mg | ORAL_CAPSULE | Freq: Two times a day (BID) | ORAL | Status: DC
  Administered 2017-09-13 – 2017-09-16 (×6): 100 mg via ORAL
  Filled 2017-09-13 (×6): qty 1

## 2017-09-13 MED ORDER — LACTATED RINGERS IV SOLN
INTRAVENOUS | Status: DC
  Administered 2017-09-13 (×5): via INTRAVENOUS

## 2017-09-13 MED ORDER — GLYCOPYRROLATE 0.2 MG/ML IV SOSY
PREFILLED_SYRINGE | INTRAVENOUS | Status: DC | PRN
  Administered 2017-09-13 (×2): .2 mg via INTRAVENOUS

## 2017-09-13 MED ORDER — MENTHOL 3 MG MT LOZG: 1.0000 | LOZENGE | OROMUCOSAL | Status: DC | PRN

## 2017-09-13 MED ORDER — SODIUM CHLORIDE 0.9 % IV SOLN
250.0000 mL | INTRAVENOUS | Status: DC
  Administered 2017-09-13: 250 mL via INTRAVENOUS

## 2017-09-13 MED ORDER — OXYCODONE HCL 5 MG PO TABS
ORAL_TABLET | ORAL | Status: AC
  Filled 2017-09-13: qty 2

## 2017-09-13 MED ORDER — METHOCARBAMOL 500 MG PO TABS
500.0000 mg | ORAL_TABLET | Freq: Four times a day (QID) | ORAL | Status: DC | PRN
  Administered 2017-09-13 – 2017-09-16 (×5): 500 mg via ORAL
  Filled 2017-09-13 (×4): qty 1

## 2017-09-13 MED ORDER — ONDANSETRON HCL 4 MG PO TABS: 4.0000 mg | ORAL_TABLET | Freq: Four times a day (QID) | ORAL | Status: DC | PRN

## 2017-09-13 MED ORDER — MIDAZOLAM HCL 5 MG/5ML IJ SOLN
INTRAMUSCULAR | Status: DC | PRN
  Administered 2017-09-13 (×2): 1 mg via INTRAVENOUS

## 2017-09-13 MED ORDER — GABAPENTIN 400 MG PO CAPS
400.0000 mg | ORAL_CAPSULE | Freq: Two times a day (BID) | ORAL | Status: DC
  Administered 2017-09-13 – 2017-09-16 (×6): 400 mg via ORAL
  Filled 2017-09-13 (×8): qty 1

## 2017-09-13 MED ORDER — BUPIVACAINE HCL (PF) 0.5 % IJ SOLN
INTRAMUSCULAR | Status: DC | PRN
  Administered 2017-09-13: 7 mL
  Administered 2017-09-13: 15 mL
  Administered 2017-09-13: 20 mL

## 2017-09-13 MED ORDER — METHOCARBAMOL 1000 MG/10ML IJ SOLN
500.0000 mg | Freq: Four times a day (QID) | INTRAVENOUS | Status: DC | PRN
  Filled 2017-09-13: qty 5

## 2017-09-13 MED ORDER — LACTATED RINGERS IV SOLN
INTRAVENOUS | Status: DC
  Administered 2017-09-13: 22:00:00 via INTRAVENOUS

## 2017-09-13 MED ORDER — PROPOFOL 1000 MG/100ML IV EMUL
INTRAVENOUS | Status: AC
  Filled 2017-09-13: qty 100

## 2017-09-13 MED ORDER — LIDOCAINE 2% (20 MG/ML) 5 ML SYRINGE
INTRAMUSCULAR | Status: DC | PRN
  Administered 2017-09-13: 100 mg via INTRAVENOUS

## 2017-09-13 MED ORDER — ONDANSETRON HCL 4 MG/2ML IJ SOLN
INTRAMUSCULAR | Status: AC
  Filled 2017-09-13: qty 2

## 2017-09-13 MED ORDER — KETAMINE HCL-SODIUM CHLORIDE 100-0.9 MG/10ML-% IV SOSY
PREFILLED_SYRINGE | INTRAVENOUS | Status: AC
  Filled 2017-09-13: qty 20

## 2017-09-13 MED ORDER — CEFAZOLIN SODIUM-DEXTROSE 2-4 GM/100ML-% IV SOLN
2.0000 g | Freq: Three times a day (TID) | INTRAVENOUS | Status: AC
  Administered 2017-09-13 – 2017-09-14 (×2): 2 g via INTRAVENOUS
  Filled 2017-09-13 (×2): qty 100

## 2017-09-13 MED ORDER — LACTATED RINGERS IV SOLN
INTRAVENOUS | Status: DC | PRN
  Administered 2017-09-13: 10:00:00 via INTRAVENOUS

## 2017-09-13 MED ORDER — SODIUM CHLORIDE 0.9% FLUSH: 3.0000 mL | INTRAVENOUS | Status: DC | PRN

## 2017-09-13 MED ORDER — ONDANSETRON HCL 4 MG/2ML IJ SOLN
INTRAMUSCULAR | Status: DC | PRN
  Administered 2017-09-13: 4 mg via INTRAVENOUS

## 2017-09-13 MED ORDER — FENTANYL CITRATE (PF) 250 MCG/5ML IJ SOLN
INTRAMUSCULAR | Status: DC | PRN
  Administered 2017-09-13: 100 ug via INTRAVENOUS
  Administered 2017-09-13 (×7): 50 ug via INTRAVENOUS

## 2017-09-13 MED ORDER — ONDANSETRON HCL 4 MG/2ML IJ SOLN: 4.0000 mg | Freq: Four times a day (QID) | INTRAMUSCULAR | Status: DC | PRN

## 2017-09-13 MED ORDER — DEXAMETHASONE SODIUM PHOSPHATE 10 MG/ML IJ SOLN
INTRAMUSCULAR | Status: DC | PRN
  Administered 2017-09-13: 10 mg via INTRAVENOUS

## 2017-09-13 MED ORDER — LIDOCAINE-EPINEPHRINE 1 %-1:100000 IJ SOLN
INTRAMUSCULAR | Status: DC | PRN
  Administered 2017-09-13: 7 mL
  Administered 2017-09-13: 15 mL

## 2017-09-13 MED ORDER — ARTIFICIAL TEARS OPHTHALMIC OINT
TOPICAL_OINTMENT | OPHTHALMIC | Status: DC | PRN
  Administered 2017-09-13: 1 via OPHTHALMIC

## 2017-09-13 MED ORDER — METHOCARBAMOL 500 MG PO TABS
ORAL_TABLET | ORAL | Status: AC
  Filled 2017-09-13: qty 1

## 2017-09-13 MED ORDER — MIDAZOLAM HCL 2 MG/2ML IJ SOLN
INTRAMUSCULAR | Status: AC
  Filled 2017-09-13: qty 2

## 2017-09-13 MED ORDER — SUGAMMADEX SODIUM 200 MG/2ML IV SOLN
INTRAVENOUS | Status: DC | PRN
  Administered 2017-09-13: 300 mg via INTRAVENOUS

## 2017-09-13 MED ORDER — PHENYLEPHRINE HCL 10 MG/ML IJ SOLN
INTRAVENOUS | Status: DC | PRN
  Administered 2017-09-13: 25 ug/min via INTRAVENOUS

## 2017-09-13 MED ORDER — OXYCODONE HCL 5 MG PO TABS: 5.0000 mg | ORAL_TABLET | Freq: Once | ORAL | Status: DC | PRN

## 2017-09-13 MED ORDER — LIDOCAINE-EPINEPHRINE 1 %-1:100000 IJ SOLN
INTRAMUSCULAR | Status: AC
Start: 2017-09-13 — End: ?
  Filled 2017-09-13: qty 1

## 2017-09-13 MED ORDER — BUPIVACAINE HCL (PF) 0.5 % IJ SOLN
INTRAMUSCULAR | Status: AC
  Filled 2017-09-13: qty 30

## 2017-09-13 MED ORDER — POLYETHYLENE GLYCOL 3350 17 G PO PACK: 17.0000 g | PACK | Freq: Every day | ORAL | Status: DC | PRN

## 2017-09-13 MED ORDER — DEXAMETHASONE SODIUM PHOSPHATE 10 MG/ML IJ SOLN
INTRAMUSCULAR | Status: AC
  Filled 2017-09-13: qty 1

## 2017-09-13 MED ORDER — OXYCODONE HCL 5 MG PO TABS
10.0000 mg | ORAL_TABLET | ORAL | Status: DC | PRN
  Administered 2017-09-13 – 2017-09-16 (×5): 10 mg via ORAL
  Filled 2017-09-13 (×4): qty 2

## 2017-09-13 MED ORDER — ARTIFICIAL TEARS OPHTHALMIC OINT
TOPICAL_OINTMENT | OPHTHALMIC | Status: AC
  Filled 2017-09-13: qty 3.5

## 2017-09-13 MED ORDER — OXYCODONE-ACETAMINOPHEN 5-325 MG PO TABS
1.0000 | ORAL_TABLET | ORAL | Status: DC | PRN
  Administered 2017-09-13 – 2017-09-16 (×11): 2 via ORAL
  Filled 2017-09-13 (×11): qty 2

## 2017-09-13 MED ORDER — OXYCODONE HCL 5 MG/5ML PO SOLN: 5.0000 mg | Freq: Once | ORAL | Status: DC | PRN

## 2017-09-13 MED ORDER — BISACODYL 10 MG RE SUPP: 10.0000 mg | Freq: Every day | RECTAL | Status: DC | PRN

## 2017-09-13 MED ORDER — PROPOFOL 500 MG/50ML IV EMUL
INTRAVENOUS | Status: DC | PRN
  Administered 2017-09-13: 12:00:00 via INTRAVENOUS
  Administered 2017-09-13: 50 ug/kg/min via INTRAVENOUS

## 2017-09-13 MED ORDER — PROPOFOL 10 MG/ML IV BOLUS
INTRAVENOUS | Status: DC | PRN
  Administered 2017-09-13: 160 mg via INTRAVENOUS

## 2017-09-13 MED ORDER — LIDOCAINE HCL (CARDIAC) 20 MG/ML IV SOLN
INTRAVENOUS | Status: AC
  Filled 2017-09-13: qty 5

## 2017-09-13 MED ORDER — THROMBIN (RECOMBINANT) 5000 UNITS EX SOLR
OROMUCOSAL | Status: DC | PRN
  Administered 2017-09-13: 09:00:00 via TOPICAL

## 2017-09-13 MED ORDER — ALBUTEROL SULFATE (2.5 MG/3ML) 0.083% IN NEBU
3.0000 mL | INHALATION_SOLUTION | Freq: Four times a day (QID) | RESPIRATORY_TRACT | Status: DC | PRN
Start: 2017-09-13 — End: 2017-09-16

## 2017-09-13 MED ORDER — EPHEDRINE 5 MG/ML INJ
INTRAVENOUS | Status: AC
  Filled 2017-09-13: qty 10

## 2017-09-13 MED ORDER — HYDROMORPHONE HCL 1 MG/ML IJ SOLN
0.2500 mg | INTRAMUSCULAR | Status: DC | PRN
  Administered 2017-09-13 (×2): 0.5 mg via INTRAVENOUS

## 2017-09-13 MED ORDER — ALUM & MAG HYDROXIDE-SIMETH 200-200-20 MG/5ML PO SUSP
30.0000 mL | Freq: Four times a day (QID) | ORAL | Status: DC | PRN
  Administered 2017-09-14 (×2): 30 mL via ORAL
  Filled 2017-09-13 (×2): qty 30

## 2017-09-13 MED ORDER — PHENOL 1.4 % MT LIQD: 1.0000 | OROMUCOSAL | Status: DC | PRN

## 2017-09-13 MED ORDER — PHENYLEPHRINE 40 MCG/ML (10ML) SYRINGE FOR IV PUSH (FOR BLOOD PRESSURE SUPPORT)
PREFILLED_SYRINGE | INTRAVENOUS | Status: AC
  Filled 2017-09-13: qty 10

## 2017-09-13 MED ORDER — PROPOFOL 10 MG/ML IV BOLUS
INTRAVENOUS | Status: AC
  Filled 2017-09-13: qty 40

## 2017-09-13 MED ORDER — SUGAMMADEX SODIUM 200 MG/2ML IV SOLN
INTRAVENOUS | Status: AC
  Filled 2017-09-13: qty 2

## 2017-09-13 MED ORDER — CEFAZOLIN SODIUM 1 G IJ SOLR
INTRAMUSCULAR | Status: AC
  Filled 2017-09-13: qty 20

## 2017-09-13 SURGICAL SUPPLY — 88 items
ADH SKN CLS APL DERMABOND .7 (GAUZE/BANDAGES/DRESSINGS) ×2
BIT DRILL LONG 3.0X30 (BIT) IMPLANT
BIT DRILL LONG 3.0X30MM (BIT)
BIT DRILL LONG 3X80 (BIT) ×1 IMPLANT
BIT DRILL LONG 3X80MM (BIT) ×1
BIT DRILL LONG 4X80 (BIT) ×1 IMPLANT
BIT DRILL LONG 4X80MM (BIT) ×1
BIT DRILL SHORT 3.0X30 (BIT) ×1 IMPLANT
BIT DRILL SHORT 3.0X30MM (BIT) ×1
BIT DRILL SHORT 3X80 (BIT) ×1 IMPLANT
BIT DRILL SHORT 3X80MM (BIT) ×1
BLADE 10 SAFETY STRL DISP (BLADE) ×2 IMPLANT
BLADE 11 SAFETY STRL DISP (BLADE) ×2 IMPLANT
BLADE CLIPPER SURG (BLADE) IMPLANT
BLADE SURG 11 STRL SS (BLADE) ×3 IMPLANT
BONE MATRIX OSTEOCEL PRO MED (Bone Implant) ×8 IMPLANT
BONE MATRIX OSTEOCEL PRO SM (Bone Implant) ×6 IMPLANT
DERMABOND ADVANCED (GAUZE/BANDAGES/DRESSINGS) ×4
DERMABOND ADVANCED .7 DNX12 (GAUZE/BANDAGES/DRESSINGS) ×1 IMPLANT
DRAPE C-ARM 42X72 X-RAY (DRAPES) ×5 IMPLANT
DRAPE C-ARMOR (DRAPES) ×5 IMPLANT
DRAPE LAPAROTOMY 100X72X124 (DRAPES) ×5 IMPLANT
DRAPE POUCH INSTRU U-SHP 10X18 (DRAPES) ×2 IMPLANT
DRAPE SHEET LG 3/4 BI-LAMINATE (DRAPES) ×3 IMPLANT
DRSG OPSITE POSTOP 3X4 (GAUZE/BANDAGES/DRESSINGS) ×2 IMPLANT
DRSG OPSITE POSTOP 4X8 (GAUZE/BANDAGES/DRESSINGS) ×6 IMPLANT
DURAPREP 26ML APPLICATOR (WOUND CARE) ×5 IMPLANT
ELECT BLADE 4.0 EZ CLEAN MEGAD (MISCELLANEOUS) ×3
ELECT REM PT RETURN 9FT ADLT (ELECTROSURGICAL) ×6
ELECTRODE BLDE 4.0 EZ CLN MEGD (MISCELLANEOUS) IMPLANT
ELECTRODE REM PT RTRN 9FT ADLT (ELECTROSURGICAL) ×1 IMPLANT
GAUZE SPONGE 4X4 16PLY XRAY LF (GAUZE/BANDAGES/DRESSINGS) IMPLANT
GLOVE BIO SURGEON STRL SZ7 (GLOVE) IMPLANT
GLOVE BIOGEL PI IND STRL 6.5 (GLOVE) IMPLANT
GLOVE BIOGEL PI IND STRL 7.0 (GLOVE) IMPLANT
GLOVE BIOGEL PI IND STRL 7.5 (GLOVE) IMPLANT
GLOVE BIOGEL PI IND STRL 8.5 (GLOVE) ×1 IMPLANT
GLOVE BIOGEL PI INDICATOR 6.5 (GLOVE) ×2
GLOVE BIOGEL PI INDICATOR 7.0 (GLOVE)
GLOVE BIOGEL PI INDICATOR 7.5 (GLOVE) ×6
GLOVE BIOGEL PI INDICATOR 8.5 (GLOVE) ×4
GLOVE ECLIPSE 8.5 STRL (GLOVE) ×7 IMPLANT
GLOVE ECLIPSE 9.0 STRL (GLOVE) ×2 IMPLANT
GLOVE EXAM NITRILE LRG STRL (GLOVE) IMPLANT
GLOVE EXAM NITRILE XL STR (GLOVE) IMPLANT
GLOVE EXAM NITRILE XS STR PU (GLOVE) IMPLANT
GLOVE SS N UNI LF 7.0 STRL (GLOVE) ×4 IMPLANT
GLOVE SURG SS PI 6.0 STRL IVOR (GLOVE) ×2 IMPLANT
GOWN STRL REUS W/ TWL LRG LVL3 (GOWN DISPOSABLE) IMPLANT
GOWN STRL REUS W/ TWL XL LVL3 (GOWN DISPOSABLE) IMPLANT
GOWN STRL REUS W/TWL 2XL LVL3 (GOWN DISPOSABLE) ×5 IMPLANT
GOWN STRL REUS W/TWL LRG LVL3 (GOWN DISPOSABLE) ×6
GOWN STRL REUS W/TWL XL LVL3 (GOWN DISPOSABLE) ×3
GUIDEWIRE NITINOL BEVEL TIP (WIRE) ×20 IMPLANT
KIT BASIN OR (CUSTOM PROCEDURE TRAY) ×5 IMPLANT
KIT DILATOR XLIF 5 (KITS) ×1 IMPLANT
KIT ROOM TURNOVER OR (KITS) ×3 IMPLANT
KIT SPINE MAZOR X ROBO DISP (MISCELLANEOUS) ×3 IMPLANT
KIT SURGICAL ACCESS MAXCESS 4 (KITS) ×2 IMPLANT
KIT XLIF (KITS) ×1
MODULE EMG NDL SSEP NVM5 (NEEDLE) IMPLANT
MODULE EMG NEEDLE SSEP NVM5 (NEEDLE) ×3 IMPLANT
MODULE NVM5 NEXT GEN EMG (NEEDLE) ×2 IMPLANT
MODULUS XLW 10X22X55MM 10 (Spine Construct) ×2 IMPLANT
MODULUS XLW 12X22X55MM 10 (Spine Construct) ×4 IMPLANT
MODULUS XLW 12X22X60MM 10 (Spine Construct) ×2 IMPLANT
NDL HYPO 25X1 1.5 SAFETY (NEEDLE) ×1 IMPLANT
NDL I-PASS III (NEEDLE) IMPLANT
NEEDLE HYPO 25X1 1.5 SAFETY (NEEDLE) ×3 IMPLANT
NEEDLE I-PASS III (NEEDLE) ×3 IMPLANT
NS IRRIG 1000ML POUR BTL (IV SOLUTION) ×3 IMPLANT
PACK LAMINECTOMY NEURO (CUSTOM PROCEDURE TRAY) ×5 IMPLANT
PIN HEAD 2.5X60MM (PIN) IMPLANT
ROD PRECEPT TI 130MM LORD PB (Rod) ×2 IMPLANT
ROD RELINE LORDOTIC 5.5X140 (Rod) ×2 IMPLANT
SCREW LOCK RELINE 5.5 TULIP (Screw) ×20 IMPLANT
SCREW MAS RELINE 6.5X45 POLY (Screw) ×4 IMPLANT
SCREW MAS RELINE 6.5X50 POLY (Screw) ×4 IMPLANT
SCREW MAS RELINE 6.5X55 POLY (Screw) ×8 IMPLANT
SCREW RELINE MAS POLY 7.5X45MM (Screw) ×4 IMPLANT
SCREW SCHANZ SA 4.0MM (MISCELLANEOUS) IMPLANT
SPONGE LAP 4X18 X RAY DECT (DISPOSABLE) IMPLANT
SUT VIC AB 2-0 CP2 18 (SUTURE) ×5 IMPLANT
SUT VIC AB 3-0 SH 8-18 (SUTURE) ×9 IMPLANT
TOWEL GREEN STERILE (TOWEL DISPOSABLE) ×3 IMPLANT
TOWEL GREEN STERILE FF (TOWEL DISPOSABLE) ×5 IMPLANT
TRAY FOLEY W/METER SILVER 16FR (SET/KITS/TRAYS/PACK) ×3 IMPLANT
WATER STERILE IRR 1000ML POUR (IV SOLUTION) ×5 IMPLANT

## 2017-09-13 NOTE — Anesthesia Procedure Notes (Signed)
Procedure Name: Intubation Date/Time: 09/13/2017 9:26 AM Performed by: Adria Dillonkin, Lucus Lambertson A, CRNA Pre-anesthesia Checklist: Patient identified, Emergency Drugs available, Suction available and Patient being monitored Patient Re-evaluated:Patient Re-evaluated prior to induction Oxygen Delivery Method: Circle system utilized Preoxygenation: Pre-oxygenation with 100% oxygen Induction Type: IV induction Ventilation: Mask ventilation without difficulty Laryngoscope Size: Miller and 3 Grade View: Grade I Tube type: Oral Tube size: 7.5 mm Number of attempts: 1 Airway Equipment and Method: Stylet Placement Confirmation: ETT inserted through vocal cords under direct vision,  positive ETCO2,  CO2 detector and breath sounds checked- equal and bilateral Secured at: 21 cm Tube secured with: Tape Dental Injury: Teeth and Oropharynx as per pre-operative assessment

## 2017-09-13 NOTE — Op Note (Signed)
Date of surgery: 09/13/2017 Preoperative diagnosis: Lumbar spondylosis with degenerative scoliosis, lumbar radiculopathy, deformity Postoperative diagnosis: Same Procedure: Anterolateral decompression L1-L2 3 L3-4 L4-5 with X LIF  and allograft percutaneous fixation L1 to L5 with pedicle screws posterior lateral fixation. Surgeon: [name] First assistant: Lelon Perla MD Anesthesia: General endotracheal Indications: Javier Bridges is a 57 year old individuals had significant lumbar spondylosis with radiculopathy that has been chronic and ongoing he has evidence of a degenerative scoliosis that has been developed over a number of years.  He was treated conservatively but having failed this he was advised regarding the need for surgical decompression from L1-L5 with anterolateral decompression using an ex lift technique followed by percutaneous screw fixation from L1 L5 to obtain reduction of the scoliosis.  Procedure: The patient was brought to the operating room for two-stage procedure with the first stage being performed in the right lateral decubitus position.  The patient was secured to the table with the table being placed in a break mode to allow some reduction of his scoliosis.  Then the lateral aspects of L1-L2 3 L3-4 and L4-5 were identified and the skin was marked after localizing the area of entry fluoroscopically.  The skin was prepped with alcohol and DuraPrep and draped in a sterile fashion.  Then the first incision was made over the region of L4-L5 posterior incision was made to enter the retroperitoneal space.  A marker was then placed over the disc space at L4-L5 and a K wire was placed into the interspace.  By checking neural monitoring no nerve roots were incurred.  Then a series of dilators were placed all the time checking neuro monitoring.  A self-retaining retractor was then placed into the wound over the L4-5 space.  Shim was placed into the disc space.  A discectomy was then  performed by using a 15 blade to open the lateral aspect of the disc and doing a thorough discectomy using combination of curettes and rongeurs evacuate substantial quantities of severely degenerated and desiccated disc material.  A 10 degree lordotic 12 mm tall 60 mm wide titanium spacer was placed into this interspace.  Attention was then turned to L3-4 were a total discectomy was again performed and an interspace pacer measuring the same dimension was placed also.  Then at L to 3 a 10 mm tall 10 degree lordotic 50 mm wide spacer was placed after a total discectomy was performed in the lateral position.  At L1-L2 a 10 mm tall 10 degree lordotic 50 mm wide spacer was also placed.  All the time the rotating the spine.  The spacers were packed with Osteo cell to provide a fusion matrix.  Once the  anterolateral decompression was completed then attention was turned to placing the hardware posteriorly.  Lateral incisions were closed with 2 and 3-0 Vicryl and then the patient was turned onto a Wiggins table in the prone position.  The back was prepped with alcohol DuraPrep and draped in a sterile fashion and then the right posterior superior iliac crest was localized and a guidepin was screwed into the.  The robot was attached to this guide pin.  Then using robotic guidance pedicle screws were placed in L2-L3 and L4.  Because of significant artifact L1 pedicle screws and L5 pedicle screws could not be placed robotically.  At L2-L3 and L4 6.5 x 50 mm screws were placed in L2 and 6.5 x 55 mm screws were placed in L3 and L4.  A 7.5 x 45  mm screws were placed in L5 using fluoroscopic guidance.  Then 120 mm rod was placed on the left side at 135 mm rod was placed on the right side.  These were placed percutaneously.  Powers were put onto the screws and the rods were reduced into position.  This allowed for good reduction of the scoliosis.  Final radiographs were obtained in AP and lateral projection.  Once positioning of  the hardware was verified and the towers were all removed the incisions were inspected and then 2 lateral incisions in the back were closed with 2-0 Vicryl in interrupted fashion with 3-0 Vicryl was used subcuticularly.  Blood loss for both portions of the procedure was estimated at 200 cc

## 2017-09-13 NOTE — Anesthesia Preprocedure Evaluation (Addendum)
Anesthesia Evaluation  Patient identified by MRN, date of birth, ID band Patient awake    Reviewed: Allergy & Precautions, NPO status   History of Anesthesia Complications Negative for: history of anesthetic complications  Airway Mallampati: II  TM Distance: >3 FB Neck ROM: Full    Dental no notable dental hx. (+) Dental Advisory Given   Pulmonary COPD, Current Smoker,    breath sounds clear to auscultation       Cardiovascular negative cardio ROS   Rhythm:Regular Rate:Normal     Neuro/Psych    GI/Hepatic (+) Hepatitis -, C  Endo/Other  Hypothyroidism   Renal/GU Renal disease     Musculoskeletal   Abdominal   Peds  Hematology  (+) anemia ,   Anesthesia Other Findings   Reproductive/Obstetrics                           Anesthesia Physical Anesthesia Plan  ASA: III  Anesthesia Plan: General   Post-op Pain Management:    Induction: Intravenous  PONV Risk Score and Plan: 3 and Ondansetron, Dexamethasone and Treatment may vary due to age or medical condition  Airway Management Planned: Oral ETT  Additional Equipment:   Intra-op Plan:   Post-operative Plan: Extubation in OR and Possible Post-op intubation/ventilation  Informed Consent: I have reviewed the patients History and Physical, chart, labs and discussed the procedure including the risks, benefits and alternatives for the proposed anesthesia with the patient or authorized representative who has indicated his/her understanding and acceptance.   Dental advisory given  Plan Discussed with:   Anesthesia Plan Comments:         Anesthesia Quick Evaluation

## 2017-09-13 NOTE — Transfer of Care (Signed)
Immediate Anesthesia Transfer of Care Note  Patient: Javier PippinRandy L Bridges  Procedure(s) Performed: Lumbar one-two Lumbar two-three Lumbar three-four Lumbar four-five Anterolateral decompression/fusion with [name]/Mazor (N/A ) lumbar one to lumbar five percutaneous screws APPLICATION OF ROBOTIC ASSISTANCE FOR SPINAL PROCEDURE (N/A )  Patient Location: PACU  Anesthesia Type:General  Level of Consciousness: awake, alert  and oriented  Airway & Oxygen Therapy: Patient Spontanous Breathing and Patient connected to nasal cannula oxygen  Post-op Assessment: Report given to RN and Post -op Vital signs reviewed and stable  Post vital signs: Reviewed and stable  Last Vitals:  Vitals:   09/13/17 0742 09/13/17 1726  BP: (!) 143/82   Pulse: (!) 55   Resp: 18   Temp: 37.1 C (P) 36.5 C  SpO2: 98%     Last Pain:  Vitals:   09/13/17 1726  TempSrc:   PainSc: (P) 0-No pain      Patients Stated Pain Goal: 3 (09/13/17 [id number])  Complications: No apparent anesthesia complications

## 2017-09-13 NOTE — H&P (Signed)
CHIEF COMPLAINT: Back pain.  HISTORY OF PRESENT ILLNESS: [name] is a 57 year old right-handed individual who tells me he has had back pain on a chronic basis for a long period of time.  It has been getting worse here in the recent past, and in October, he underwent some plain x-rays of the lumbar spine.  These revealed the presence of a degenerative scoliosis in the lower lumbar spine, with a slight degenerative situation to the left side at L4-L5 and to the right side at L1-L2.  The curve itself measures less than 20 degrees.  He has some flattening of his lower lumbar spine but no evidence of a listhesis that is evident to any significant degree.  There does appear to be some significant disc height loss at L2-L3 with a slight retrolisthesis at that level, and overall, he has loss of a lot of lordosis in his lumbar spine.  He has some early atherosclerotic changes in the aorta that is also noted.  PAST MEDICAL HISTORY: Javier Bridges past medical history is notable for history of hepatitis C, which has been treated successfully.  He also tells me that he has some COPD, and a recent chest x-ray demonstrates that he has some emphysematous changes with flattening of the bases, hyperinflation of both lung fields.  He is a smoker, smoking about a pack of cigarettes every 2 days; this has been a long-time history.  REVIEW OF SYSTEMS: Notable for kidney stones, arthritis, back pain, joint pain and swelling on a 14-point review sheet.  PHYSICAL EXAMINATION: I note that he stands fairly straight.  He walks on his feet with good strength in the iliopsoas and the quads.  He is able to toe and heel walk, suggesting good strength in tibialis anterior and gastrocs.  His deep tendon reflexes are 1+ in both patellae and 1+ in both Achilles, and straight-leg raising is negative to 80 degrees bilaterally.  Patrick maneuver is negative bilaterally, also.  IMPRESSION: The patient is a 57 year old individual who has  chronic back pain with a degree of degenerative scoliosis of the lumbar spine that is moderate.  I have advised that, given the fact that he is having primarily centralized back pain with some radicular components, we should consider an MRI of the lumbar spine.  This would give me a better idea whether he has any significant stenosis that may need to be decompressed or whether there is anything further that can be done to treat this process in a minimally invasive consideration.  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  Though this has been treated, often times the neuropathic symptoms tend to continue on after successful treatment. He  had an MRI of his lumbar spine.  Javier Bridges has a progressive degenerative scoliosis on his plain x-rays.  This measures 22 degrees between L1 and L4 on an AP image.  I note that he has a slight lateral listhesis at L2-3 and L3-4 as the worst areas of his curvature.  He does, however, have a slight anterolisthesis at L4-L5 and he has significant subarticular stenosis at the L4-5 level in addition to lateral recess stenosis at L2-3 and 3-4. At L1-2, he has a left-sided disc protrusion that causes some foraminal stenosis for the L2 nerve root down below. Above  this, his spine appears to straighten out quite well.  Javier Bridges has been having significant difficulties with back pain and radicular symptoms.  His other health has been good, save for the fact that he is a smoker.  He is not diabetic.  He maintains his weight fairly stably.  Nonetheless, the level of back pain that he has been having has been increasingly debilitating.   I noted to him that, ultimately, Javier Bridges will need to have surgery to stabilize and de rotate his scoliosis.  This could be done by an anterolateral decompression at  L1-2, 2-3, L3-4, and L4-5 with percutaneous pedicle screw fixation from L1-L5.  This is a substantial operation. I noted to him that it is done in two stages, one from the side and one from the back.  There is potential for injury to the big blood vessels in his abdomen and also the nerves that exit, but in order to try to prevent this process from worsening over time.  I believe that this is the surgery that he will require. Given his situation we will plan on proceeding with the surgery once we can gain approval. In the meantime, I did suggest that we have him enroll in a physical therapy program to see if lengthening and strengthening his back can help mitigate his symptoms.

## 2017-09-14 ENCOUNTER — Inpatient Hospital Stay (HOSPITAL_COMMUNITY): Payer: Medicaid Other

## 2017-09-14 ENCOUNTER — Encounter (HOSPITAL_COMMUNITY): Payer: Self-pay | Admitting: Neurological Surgery

## 2017-09-14 LAB — BASIC METABOLIC PANEL
Anion gap: 8 (ref 5–15)
BUN: 10 mg/dL (ref 6–20)
CHLORIDE: 101 mmol/L (ref 101–111)
CO2: 26 mmol/L (ref 22–32)
CREATININE: 1.03 mg/dL (ref 0.61–1.24)
Calcium: 8.6 mg/dL — ABNORMAL LOW (ref 8.9–10.3)
GFR calc Af Amer: >60 mL/min (ref >60)
GLUCOSE: 133 mg/dL — AB (ref 65–99)
POTASSIUM: 4.2 mmol/L (ref 3.5–5.1)
Sodium: 135 mmol/L (ref 135–145)

## 2017-09-14 LAB — CBC
HCT: 32.9 % — ABNORMAL LOW (ref 39.0–52.0)
Hemoglobin: 11 g/dL — ABNORMAL LOW (ref 13.0–17.0)
MCH: 29.2 pg (ref 26.0–34.0)
MCHC: 33.4 g/dL (ref 30.0–36.0)
MCV: 87.3 fL (ref 78.0–100.0)
PLATELETS: 205 10*3/uL (ref 150–400)
RBC: 3.77 MIL/uL — AB (ref 4.22–5.81)
RDW: 15.4 % (ref 11.5–15.5)
WBC: 13.8 10*3/uL — ABNORMAL HIGH (ref 4.0–10.5)

## 2017-09-14 MED FILL — Thrombin For Soln 5000 Unit: CUTANEOUS | Qty: 5000 | Status: AC

## 2017-09-14 NOTE — Care Management Note (Signed)
Case Management Note  Patient Details  Name: Javier PippinRandy L Bridges MRN: [id number] Date of Birth: December 17, 1960  Subjective/Objective:      Pt s/p lumbar surgery. He is from home alone.              Action/Plan: No f/u per PT and no DME. Awaiting OT eval. CM following for d/c needs, physician orders.   Expected Discharge Date:                  Expected Discharge Plan:     In-House Referral:     Discharge planning Services     Post Acute Care Choice:    Choice offered to:     DME Arranged:    DME Agency:     HH Arranged:    HH Agency:     Status of Service:  In process, will continue to follow  If discussed at Long Length of Stay Meetings, dates discussed:    Additional Comments:  Kermit BaloKelli F Tiffanny Lamarche, RN 09/14/2017, 12:53 PM

## 2017-09-14 NOTE — Progress Notes (Signed)
Orthopedic Tech Progress Note Patient Details:  Javier PippinRandy L Bridges 05/28/1961 [id number]  Patient ID: Javier Bridges, male   DOB: 05/28/1961, 57 y.o.   MRN: [id number]   Nikki DomCrawford, Dierdra Salameh 09/14/2017, 9:05 AM Called in bio-tech brace order; [facility]

## 2017-09-14 NOTE — Evaluation (Signed)
Occupational Therapy Evaluation Patient Details Name: Javier Bridges MRN: [id number] DOB: 12-14-1960 Today's Date: 09/14/2017    History of Present Illness 57 yo male s/p anterolateral decompression and PLIF L1-L2, L3-4, and L4-5 and L1-L5 fixation. PMH includes hypothyroidism, bradycardia, hepatitis C, and chronic low back pain.   Clinical Impression   PTA, pt was living alone and was independent. Currently, pt performing ADLs and functional mobility using RW at supervision level. Provided education on back precautions, brace management, LB ADLs, toilet transfer, and tub transfer; pt demonstrated understanding. Answered all pt questions. Recommend dc home once medically stable per physician. All acute OT needs met and will sign off. Thank you.     Follow Up Recommendations  No OT follow up;Supervision - Intermittent    Equipment Recommendations  None recommended by OT(Discussed use of 3n1; pt declining)    Recommendations for Other Services PT consult     Precautions / Restrictions Precautions Precautions: Fall;Back Precaution Booklet Issued: Yes (comment) Precaution Comments: pt able to recall 2/3 back precautions with Min cues for "no lifting" Required Braces or Orthoses: Spinal Brace Spinal Brace: Lumbar corset;Applied in sitting position Restrictions Weight Bearing Restrictions: No      Mobility Bed Mobility Overal bed mobility: Needs Assistance Bed Mobility: Rolling;Sidelying to Sit Rolling: Supervision Sidelying to sit: Supervision       General bed mobility comments: supervision for safety. demonstrating understanding of log roll  Transfers Overall transfer level: Needs assistance Equipment used: Rolling walker (2 wheeled) Transfers: Sit to/from Stand Sit to Stand: Supervision         General transfer comment: supervision for safety and line management, VCs for hand placement    Balance                                           ADL  either performed or assessed with clinical judgement   ADL Overall ADL's : Needs assistance/impaired Eating/Feeding: Set up;Sitting   Grooming: Set up;Supervision/safety;Standing Grooming Details (indicate cue type and reason): Educating pt on compensatory techniques for adherance to back precautions during grooming Upper Body Bathing: Supervision/ safety;Set up;Standing Upper Body Bathing Details (indicate cue type and reason): Educating pt on compensatory techniques for adherance to back precautions and sue of HH shower head Lower Body Bathing: Supervison/ safety;Set up;Sit to/from stand Lower Body Bathing Details (indicate cue type and reason): Educating pt on compensatory techniques for adherance to back precautions Upper Body Dressing : Set up;Supervision/safety;Sitting Upper Body Dressing Details (indicate cue type and reason): Pt donning/doffing back brace with supervision demosntrating understanding Lower Body Dressing: Set up;Supervision/safety;Sit to/from stand;Cueing for sequencing;Adhering to back precautions Lower Body Dressing Details (indicate cue type and reason): Educating pt on compensatory techniques for adherance to back precautions. Pt able to bring ankels to knees for donning socks. Pt donning underwear with compensatory techniques and adherance to back precautions.  Toilet Transfer: Supervision/safety;Ambulation;Regular Toilet;RW     Toileting - Clothing Manipulation Details (indicate cue type and reason): Educating pt on compensatory techniques for adherance to back precautions during toilet hygiene after BM Tub/ Shower Transfer: Tub transfer;Min guard;Cueing for sequencing;Ambulation;Rolling walker Tub/Shower Transfer Details (indicate cue type and reason): Pt demonstrating safe technique for tub transfer Functional mobility during ADLs: Supervision/safety;Rolling walker General ADL Comments: Providing education on back precautions, brace management, LB ADLs, bed  mobility, and safety. Pt verbalizing and demonstrating understanding.      Vision  Perception     Praxis      Pertinent Vitals/Pain Pain Assessment: Faces Faces Pain Scale: Hurts little more Pain Location: back and left flank incisions Pain Descriptors / Indicators: Aching;Sore Pain Intervention(s): Monitored during session;Repositioned     Hand Dominance Right   Extremity/Trunk Assessment Upper Extremity Assessment Upper Extremity Assessment: Overall WFL for tasks assessed   Lower Extremity Assessment Lower Extremity Assessment: Overall WFL for tasks assessed   Cervical / Trunk Assessment Cervical / Trunk Assessment: Other exceptions Cervical / Trunk Exceptions: s/p back surgery   Communication Communication Communication: No difficulties   Cognition Arousal/Alertness: Awake/alert Behavior During Therapy: WFL for tasks assessed/performed Overall Cognitive Status: Within Functional Limits for tasks assessed                                     General Comments  VSS    Exercises     Shoulder Instructions      Home Living Family/patient expects to be discharged to:: Private residence Living Arrangements: Alone Available Help at Discharge: Family;Available PRN/intermittently Type of Home: Mobile home Home Access: Stairs to enter Entrance Stairs-Number of Steps: 3 Entrance Stairs-Rails: Left Home Layout: One level     Bathroom Shower/Tub: Tub/shower unit;Curtain   Bathroom Toilet: Standard Bathroom Accessibility: Yes   Home Equipment: Environmental consultant - 2 wheels;Cane - single point          Prior Functioning/Environment Level of Independence: Independent(occasionally uses cane due to back pain)                 OT Problem List: Decreased strength;Decreased range of motion;Decreased activity tolerance;Impaired balance (sitting and/or standing);Decreased safety awareness;Decreased knowledge of use of DME or AE;Decreased knowledge of  precautions;Pain      OT Treatment/Interventions:      OT Goals(Current goals can be found in the care plan section) Acute Rehab OT Goals Patient Stated Goal: to have less back pain OT Goal Formulation: All assessment and education complete, DC therapy  OT Frequency:     Barriers to D/C:            Co-evaluation              AM-PAC PT "6 Clicks" Daily Activity     Outcome Measure Help from another person eating meals?: None Help from another person taking care of personal grooming?: A Little Help from another person toileting, which includes using toliet, bedpan, or urinal?: A Little Help from another person bathing (including washing, rinsing, drying)?: A Little Help from another person to put on and taking off regular upper body clothing?: None Help from another person to put on and taking off regular lower body clothing?: A Little 6 Click Score: 20   End of Session Equipment Utilized During Treatment: Back brace;Rolling walker Nurse Communication: Mobility status;Precautions  Activity Tolerance: Patient tolerated treatment well Patient left: in chair;with call bell/phone within reach;with family/visitor present  OT Visit Diagnosis: Unsteadiness on feet (R26.81);Other abnormalities of gait and mobility (R26.89);Muscle weakness (generalized) (M62.81);Pain Pain - part of body: (Back)                Time: 3846-6599 OT Time Calculation (min): 21 min Charges:  OT General Charges $OT Visit: 1 Visit OT Evaluation $OT Eval Moderate Complexity: 1 Mod G-Codes:     [place] MSOT, OTR/L Acute Rehab Pager: [phone] Office: [place] 09/14/2017, 4:41 PM

## 2017-09-14 NOTE — Evaluation (Signed)
Physical Therapy Evaluation Patient Details Name: Javier Bridges MRN: [id number] DOB: 05-16-1961 Today's Date: 09/14/2017   History of Present Illness  57 yo male s/p anterolateral decompression and PLIF L1-L2, L3-4, and L4-5 and L1-L5 fixation. PMH includes hypothyroidism, bradycardia, hepatitis C, and chronic low back pain.  Clinical Impression  Pt presents with overall decrease in functional mobility secondary to above including impairments listed below (see PT Problem List). Pt supervision for all functional mobility at this time. Pt's brace did not arrive yet but maintained precautions throughout session and informed pt that he should be wearing brace for all functional mobility once he receives it. Educated pt on back precautions and provided handout, car transfers, and mobility expectations. Pt to benefit from continued acute PT to maximize safety, functional mobility, and independence prior to d/c home with PRN assist from family. Anticipated pt will continue to progress at home and will not require further PT services.    Follow Up Recommendations No PT follow up    Equipment Recommendations  None recommended by PT    Recommendations for Other Services       Precautions / Restrictions Precautions Precautions: Fall;Back Precaution Booklet Issued: Yes (comment) Precaution Comments: educated pt on BLT precautions Required Braces or Orthoses: Spinal Brace Spinal Brace: Lumbar corset;Applied in sitting position Restrictions Weight Bearing Restrictions: No      Mobility  Bed Mobility Overal bed mobility: Needs Assistance Bed Mobility: Rolling;Sidelying to Sit Rolling: Supervision Sidelying to sit: Supervision       General bed mobility comments: supervision for safety, VCs for technique  Transfers Overall transfer level: Needs assistance Equipment used: Rolling walker (2 wheeled) Transfers: Sit to/from Stand Sit to Stand: Supervision         General transfer  comment: supervision for safety and line management, VCs for hand placement  Ambulation/Gait Ambulation/Gait assistance: Supervision Ambulation Distance (Feet): 300 Feet Assistive device: Rolling walker (2 wheeled) Gait Pattern/deviations: Step-through pattern;Trunk flexed Gait velocity: decreased   General Gait Details: pt able to manage walker safely with no LOB throughout, able increase cadence with cues. VCs for upright posture.  Stairs            Wheelchair Mobility    Modified Rankin (Stroke Patients Only)       Balance Overall balance assessment: Mild deficits observed, not formally tested                                           Pertinent Vitals/Pain Pain Assessment: 0-10 Pain Score: 6  Pain Location: back and left flank incisions Pain Descriptors / Indicators: Aching Pain Intervention(s): Monitored during session;Limited activity within patient's tolerance;Repositioned    Home Living Family/patient expects to be discharged to:: Private residence Living Arrangements: Alone Available Help at Discharge: Family;Available PRN/intermittently Type of Home: Mobile home Home Access: Stairs to enter Entrance Stairs-Rails: Left Entrance Stairs-Number of Steps: 3 Home Layout: One level Home Equipment: Walker - 2 wheels;Cane - single point      Prior Function Level of Independence: Independent(occasionally uses cane due to back pain)               Hand Dominance   Dominant Hand: Right    Extremity/Trunk Assessment   Upper Extremity Assessment Upper Extremity Assessment: Overall WFL for tasks assessed    Lower Extremity Assessment Lower Extremity Assessment: Overall WFL for tasks assessed  Communication   Communication: No difficulties  Cognition Arousal/Alertness: Awake/alert Behavior During Therapy: WFL for tasks assessed/performed Overall Cognitive Status: Within Functional Limits for tasks assessed                                         General Comments General comments (skin integrity, edema, etc.): VSS    Exercises     Assessment/Plan    PT Assessment Patient needs continued PT services  PT Problem List Decreased balance;Decreased mobility;Decreased strength;Decreased knowledge of use of DME;Pain       PT Treatment Interventions DME instruction;Gait training;Stair training;Functional mobility training;Therapeutic activities;Therapeutic exercise;Balance training;Neuromuscular re-education;Patient/family education;Manual techniques;Modalities    PT Goals (Current goals can be found in the Care Plan section)  Acute Rehab PT Goals Patient Stated Goal: to have less back pain PT Goal Formulation: With patient Time For Goal Achievement: 09/28/17 Potential to Achieve Goals: Good    Frequency Min 5X/week   Barriers to discharge        Co-evaluation               AM-PAC PT "6 Clicks" Daily Activity  Outcome Measure Difficulty turning over in bed (including adjusting bedclothes, sheets and blankets)?: None Difficulty moving from lying on back to sitting on the side of the bed? : None Difficulty sitting down on and standing up from a chair with arms (e.g., wheelchair, bedside commode, etc,.)?: None Help needed moving to and from a bed to chair (including a wheelchair)?: None Help needed walking in hospital room?: None Help needed climbing 3-5 steps with a railing? : A Little 6 Click Score: 23    End of Session Equipment Utilized During Treatment: Gait belt Activity Tolerance: Patient tolerated treatment well Patient left: in bed;with call bell/phone within reach Nurse Communication: Mobility status PT Visit Diagnosis: Difficulty in walking, not elsewhere classified (R26.2);Muscle weakness (generalized) (M62.81)    Time: [id number] PT Time Calculation (min) (ACUTE ONLY): 18 min   Charges:   PT Evaluation $PT Eval Low Complexity: 1 Low     PT G Codes:         Javier Bridges, Javier Bridges  Javier Bridges 09/14/2017, 11:38 AM

## 2017-09-14 NOTE — Progress Notes (Signed)
Patient ID: Javier PippinRandy L Bridges, male   DOB: 1960-08-31, 57 y.o.   MRN: [id number] Vital signs are stable Patient had a greater than 3 g hemoglobin drop from preoperative blood work secondary to acute blood loss anemia secondary to surgery Clinically he is doing well He is ambulatory We will obtain some plain x-rays of the lumbar spine Overall pleased with progress postoperatively

## 2017-09-15 NOTE — Progress Notes (Signed)
Physical Therapy Treatment Patient Details Name: Javier Bridges MRN: [id number] DOB: 14-Mar-1961 Today's Date: 09/15/2017    History of Present Illness 57 yo male s/p anterolateral decompression and PLIF L1-L2, L3-4, and L4-5 and L1-L5 fixation. PMH includes hypothyroidism, bradycardia, hepatitis C, and chronic low back pain.    PT Comments    Patient progressing well towards PT goals. Tolerated gait training without use of RW today and noted mild drifting but no overt LOB. Tolerated stair training with Min guard assist for balance. Able to recall 3/3 back precautions. Discussed sitting tolerance and recommended time as well as car transfer. Pt eager to return home today. Will follow acutely.    Follow Up Recommendations  No PT follow up;Supervision - Intermittent     Equipment Recommendations  None recommended by PT    Recommendations for Other Services       Precautions / Restrictions Precautions Precautions: Fall;Back Precaution Booklet Issued: Yes (comment) Precaution Comments: Able to recall 3/3 precautions Required Braces or Orthoses: Spinal Brace Spinal Brace: Lumbar corset;Applied in sitting position Restrictions Weight Bearing Restrictions: No    Mobility  Bed Mobility Overal bed mobility: Needs Assistance Bed Mobility: Rolling;Sidelying to Sit Rolling: Supervision Sidelying to sit: Supervision;HOB elevated       General bed mobility comments: supervision for safety. demonstrating understanding of log roll  Transfers Overall transfer level: Needs assistance Equipment used: Rolling walker (2 wheeled) Transfers: Sit to/from Stand Sit to Stand: Supervision         General transfer comment: supervision for safety; VCs for hand placement  Ambulation/Gait Ambulation/Gait assistance: Supervision Ambulation Distance (Feet): 250 Feet Assistive device: Rolling walker (2 wheeled);None Gait Pattern/deviations: Step-through pattern;Trunk flexed Gait velocity:  decreased   General Gait Details: Started using RW for support, cues for upright posture progressed to no UE support for gait. Mild drifting but no overt LOB.   Stairs Stairs: Yes   Stair Management: Alternating pattern;One rail Left Number of Stairs: 3 General stair comments: Cues for technique/safety.   Wheelchair Mobility    Modified Rankin (Stroke Patients Only)       Balance Overall balance assessment: Needs assistance Sitting-balance support: Feet supported;No upper extremity supported Sitting balance-Leahy Scale: Good Sitting balance - Comments: Able to donn brace with setup   Standing balance support: During functional activity Standing balance-Leahy Scale: Fair                              Cognition Arousal/Alertness: Awake/alert Behavior During Therapy: WFL for tasks assessed/performed Overall Cognitive Status: Within Functional Limits for tasks assessed                                        Exercises      General Comments General comments (skin integrity, edema, etc.): VSS      Pertinent Vitals/Pain Pain Assessment: Faces Faces Pain Scale: Hurts little more Pain Location: back and left flank incisions Pain Descriptors / Indicators: Aching;Sore;Operative site guarding Pain Intervention(s): Monitored during session;Repositioned;Premedicated before session    Home Living                      Prior Function            PT Goals (current goals can now be found in the care plan section) Progress towards PT goals: Progressing toward goals  Frequency    Min 5X/week      PT Plan Current plan remains appropriate    Co-evaluation              AM-PAC PT "6 Clicks" Daily Activity  Outcome Measure  Difficulty turning over in bed (including adjusting bedclothes, sheets and blankets)?: None Difficulty moving from lying on back to sitting on the side of the bed? : None Difficulty sitting down on and  standing up from a chair with arms (e.g., wheelchair, bedside commode, etc,.)?: None Help needed moving to and from a bed to chair (including a wheelchair)?: None Help needed walking in hospital room?: None Help needed climbing 3-5 steps with a railing? : A Little 6 Click Score: 23    End of Session Equipment Utilized During Treatment: Gait belt;Back brace Activity Tolerance: Patient tolerated treatment well Patient left: in bed;with call bell/phone within reach;with family/visitor present(sitting EOB.) Nurse Communication: Mobility status PT Visit Diagnosis: Difficulty in walking, not elsewhere classified (R26.2);Muscle weakness (generalized) (M62.81)     Time: 0902-0920 PT Time Calculation (min) (ACUTE ONLY): 18 min  Charges:  $Gait Training: 8-22 mins                    G Codes:       Mylo RedShauna Lars Jeziorski, PT, DPT [phone]     Blake DivineShauna A Lanier EnsignHartshorne 09/15/2017, 9:37 AM

## 2017-09-15 NOTE — Progress Notes (Addendum)
Patient ID: [name], male   DOB: 14-Dec-1960, 57 y.o.   MRN: [id number] Vital signs are stable Motor function appears good Incisions are clean and dry Continue to mobilize Consider discharge for tomorrow X-rays show good overall alignment

## 2017-09-16 MED ORDER — OXYCODONE-ACETAMINOPHEN 10-325 MG PO TABS: 1.0000 | ORAL_TABLET | ORAL | 0 refills | Status: DC | PRN

## 2017-09-16 MED ORDER — METHOCARBAMOL 500 MG PO TABS: 500.0000 mg | ORAL_TABLET | Freq: Four times a day (QID) | ORAL | 3 refills | Status: DC | PRN

## 2017-09-16 MED FILL — Heparin Sodium (Porcine) Inj 1000 Unit/ML: INTRAMUSCULAR | Qty: 30 | Status: AC

## 2017-09-16 MED FILL — Sodium Chloride IV Soln 0.9%: INTRAVENOUS | Qty: 1000 | Status: AC

## 2017-09-16 NOTE — Care Management Note (Signed)
Case Management Note  Patient Details  Name: Javier Bridges MRN: [id number] Date of Birth: 07/06/1960  Subjective/Objective:                    Action/Plan: Pt discharging home with self care. Pt has supportive mother and friends that can assist at home. Pt with orders for 3 in 1. [facility]Donna [facility] notified and will deliver to the room.  Pt states his mother will provide transportation home.   Expected Discharge Date:  09/16/17               Expected Discharge Plan:  Home/Self Care  In-House Referral:     Discharge planning Services  CM Consult  Post Acute Care Choice:  Durable Medical Equipment Choice offered to:  Patient  DME Arranged:  3-N-1 DME Agency:  [facility]  [facility]:     Status of Service:  Completed, signed off  If discussed at Long Length of Stay Meetings, dates discussed:    Additional Comments:  Kermit BaloKelli F Ameria Sanjurjo, RN 09/16/2017, 10:12 AM

## 2017-09-16 NOTE — Anesthesia Postprocedure Evaluation (Signed)
Anesthesia Post Note  Patient: Javier Bridges  Procedure(s) Performed: Lumbar one-two Lumbar two-three Lumbar three-four Lumbar four-five Anterolateral decompression/fusion with [name]/Mazor (N/A ) lumbar one to lumbar five percutaneous screws APPLICATION OF ROBOTIC ASSISTANCE FOR SPINAL PROCEDURE (N/A )     Patient location during evaluation: PACU Anesthesia Type: General Level of consciousness: awake and alert Pain management: pain level controlled Vital Signs Assessment: post-procedure vital signs reviewed and stable Respiratory status: spontaneous breathing, nonlabored ventilation, respiratory function stable and patient connected to nasal cannula oxygen Cardiovascular status: blood pressure returned to baseline and stable Postop Assessment: no apparent nausea or vomiting Anesthetic complications: no    Last Vitals:  Vitals:   09/16/17 0002 09/16/17 0725  BP: 103/66 114/69  Pulse: 72 73  Resp: 18 16  Temp: 37.1 C 36.8 C  SpO2: 94% 95%    Last Pain:  Vitals:   09/16/17 1000  TempSrc:   PainSc: 5                  Neeya Prigmore

## 2017-09-16 NOTE — Discharge Summary (Signed)
Physician Discharge Summary  Patient ID: Javier Bridges MRN: [id number] DOB/AGE: 1961-01-23 57 y.o.  Admit date: 09/13/2017 Discharge date: 09/16/2017  Admission Diagnoses: Degenerative scoliosis with stenosis and lumbar radiculopathy L2-3 L3-4 L4-5  Discharge Diagnoses: Degenerative scoliosis with stenosis and lumbar radiculopathy L2-3 L3-4 L4-5, acute blood loss anemia. Active Problems:   Lumbar scoliosis   Discharged Condition: good  Hospital Course: Patient was admitted to undergo surgical decompression and stabilization from L2-L5.  He underwent anterolateral decompression using an indirect technique and placement of interbody spacers at L2-3 L3-4 and L4-5.  He then underwent posterior percutaneous pedicle screw placement using robotic assistance.  Total blood loss for the surgery was 200 cc however he had a greater than 3 g drop in his hemoglobin postoperatively confirming the presence of acute blood loss anemia.  He did not require transfusion.  He is ambulatory.  He is doing well.  Consults: None  Significant Diagnostic Studies: None  Treatments: surgery: Anterolateral decompression L2-3 L3-4 L4-5 with anterolateral graft placement and titanium spacer placement percutaneous fixation L2-L5 with pedicle screws.  Discharge Exam: Blood pressure 114/69, pulse 73, temperature 98.3 F (36.8 C), temperature source Oral, resp. rate 16, SpO2 95 %. Incisions are clean and dry.  Station and gait are intact.  Disposition: 01-Home or Self Care  Discharge Instructions    Call MD for:  redness, tenderness, or signs of infection (pain, swelling, redness, odor or green/yellow discharge around incision site)   Complete by:  As directed    Call MD for:  severe uncontrolled pain   Complete by:  As directed    Call MD for:  temperature >100.4   Complete by:  As directed    Diet - low sodium heart healthy   Complete by:  As directed    Discharge instructions   Complete by:  As directed    Okay to shower. Do not apply salves or appointments to incision. No heavy lifting with the upper extremities greater than 15 pounds. May resume driving when not requiring pain medication and patient feels comfortable with doing so.   Incentive spirometry RT   Complete by:  As directed    Increase activity slowly   Complete by:  As directed      Allergies as of 09/16/2017      Reactions   Tricor [fenofibrate]    Chest pain      Medication List    TAKE these medications   albuterol 108 (90 Base) MCG/ACT inhaler Commonly known as:  PROVENTIL HFA;VENTOLIN HFA Inhale 2 puffs into the lungs every 6 (six) hours as needed for wheezing or shortness of breath.   aspirin 500 MG tablet Take 500 mg by mouth daily.   budesonide-formoterol 80-4.5 MCG/ACT inhaler Commonly known as:  SYMBICORT Inhale 2 puffs into the lungs 2 (two) times daily.   gabapentin 400 MG capsule Commonly known as:  NEURONTIN TAKE 1 CAPSULE(400 MG) BY MOUTH THREE TIMES DAILY   levothyroxine 25 MCG tablet Commonly known as:  SYNTHROID, LEVOTHROID TAKE 1 TABLET BY MOUTH EVERY DAY BEFORE BREAKFAST   methocarbamol 500 MG tablet Commonly known as:  ROBAXIN Take 1 tablet (500 mg total) by mouth 3 (three) times daily as needed for muscle spasms. What changed:  Another medication with the same name was added. Make sure you understand how and when to take each.   methocarbamol 500 MG tablet Commonly known as:  ROBAXIN Take 1 tablet (500 mg total) by mouth every 6 (six) hours as needed  for muscle spasms. What changed:  You were already taking a medication with the same name, and this prescription was added. Make sure you understand how and when to take each.   oxyCODONE-acetaminophen 10-325 MG tablet Commonly known as:  PERCOCET Take 1 tablet by mouth every 4 (four) hours as needed for pain.            Durable Medical Equipment  (From admission, onward)        Start     Ordered   09/16/17 0841  DME 3-in-1   Once     09/16/17 0841       Signed: Stefani DamaELSNER,Ayad Nieman J 09/16/2017, 8:42 AM

## 2017-09-30 ENCOUNTER — Ambulatory Visit: Payer: Medicaid Other | Admitting: Family Medicine

## 2017-10-11 NOTE — Addendum Note (Signed)
Addendum  created 10/11/17 1337 by Sharee HolsterMassagee, Chante Mayson, MD   Sign clinical note

## 2017-11-26 ENCOUNTER — Encounter: Payer: Self-pay | Admitting: Family Medicine

## 2017-11-26 ENCOUNTER — Ambulatory Visit (INDEPENDENT_AMBULATORY_CARE_PROVIDER_SITE_OTHER): Payer: Medicaid Other | Admitting: Family Medicine

## 2017-11-26 VITALS — BP 102/71 | HR 72 | Temp 99.1°F | Resp 16 | Ht 69.0 in | Wt 156.0 lb

## 2017-11-26 DIAGNOSIS — E039 Hypothyroidism, unspecified: Secondary | ICD-10-CM

## 2017-11-26 DIAGNOSIS — F17200 Nicotine dependence, unspecified, uncomplicated: Secondary | ICD-10-CM

## 2017-11-26 DIAGNOSIS — F17210 Nicotine dependence, cigarettes, uncomplicated: Secondary | ICD-10-CM

## 2017-11-26 MED ORDER — LEVOTHYROXINE SODIUM 25 MCG PO TABS: ORAL_TABLET | ORAL | 3 refills | Status: DC

## 2017-11-26 NOTE — Progress Notes (Signed)
Subjective:     Javier Bridges is a 57 y.o. male who presents for follow up of hypothyroidism. He states that he has been taking prescribed medications consistently. He denies  change in energy level, diarrhea, heat / cold intolerance, nervousness, palpitations and weight changes. Past Medical History:  Diagnosis Date  . Bradycardia   . Chronic back pain   . Hepatitis C, chronic (HCC)    tx with harvoni  . Hypothyroidism   . Tobacco dependence    Social History   Socioeconomic History  . Marital status: Single    Spouse name: Not on file  . Number of children: Not on file  . Years of education: Not on file  . Highest education level: Not on file  Occupational History  . Not on file  Social Needs  . Financial resource strain: Not on file  . Food insecurity:    Worry: Not on file    Inability: Not on file  . Transportation needs:    Medical: Not on file    Non-medical: Not on file  Tobacco Use  . Smoking status: Current Every Day Smoker    Packs/day: 0.25    Types: Cigarettes  . Smokeless tobacco: Never Used  Substance and Sexual Activity  . Alcohol use: Yes    Alcohol/week: 8.4 oz    Types: 14 Cans of beer per week    Comment: socially  . Drug use: Yes    Types: Marijuana    Comment: last time was 2/28  . Sexual activity: Never  Lifestyle  . Physical activity:    Days per week: Not on file    Minutes per session: Not on file  . Stress: Not on file  Relationships  . Social connections:    Talks on phone: Not on file    Gets together: Not on file    Attends religious service: Not on file    Active member of club or organization: Not on file    Attends meetings of clubs or organizations: Not on file    Relationship status: Not on file  . Intimate partner violence:    Fear of current or ex partner: Not on file    Emotionally abused: Not on file    Physically abused: Not on file    Forced sexual activity: Not on file  Other Topics Concern  . Not on file   Social History Narrative  . Not on file   Immunization History  Administered Date(s) Administered  . Hepatitis A, Adult 12/15/2016  . Pneumococcal Polysaccharide-23 05/03/2015  . Tdap 09/30/2016  Review of Systems  Constitutional: Negative.   HENT: Negative.   Eyes: Negative.   Respiratory: Negative.   Cardiovascular: Negative.   Genitourinary: Negative.   Musculoskeletal: Negative.   Skin: Negative.   Neurological: Negative.   Endo/Heme/Allergies: Negative.      Objective:   Laboratory: Lab Results  Component Value Date   TSH 3.62 05/31/2017     Physical Exam  Constitutional: He is oriented to person, place, and time. He appears well-developed and well-nourished.  HENT:  Head: Normocephalic.  Eyes: Pupils are equal, round, and reactive to light.  Neck: Normal range of motion.  Cardiovascular: Normal rate, regular rhythm and normal heart sounds.  Pulmonary/Chest: Effort normal and breath sounds normal.  Abdominal: Soft. Bowel sounds are normal.  Neurological: He is alert and oriented to person, place, and time.  Skin: Skin is warm and dry.  Psychiatric: He has a normal mood and  affect. His behavior is normal. Judgment and thought content normal.   Assessment:    BP 102/71 (BP Location: Left Arm, Patient Position: Sitting, Cuff Size: Normal)   Pulse 72   Temp 99.1 F (37.3 C) (Oral)   Resp 16   Ht  (1.753 m)   Wt 156 lb (70.8 kg)   SpO2 96%   BMI 23.04 kg/m  Plan:  1. Acquired hypothyroidism We will recheck thyroid function test in 3 months.  Patient instructed not to take multivitamins or iron within 4 hours of taking thyroid medications - Thyroid Panel With TSH - levothyroxine (SYNTHROID, LEVOTHROID) 25 MCG tablet; TAKE 1 TABLET BY MOUTH EVERY DAY BEFORE BREAKFAST  Dispense: 90 tablet; Refill: 3  2. Tobacco dependence  Smoking cessation instruction/counseling given:  counseled patient on the dangers of tobacco use, advised patient to stop smoking,  and reviewed strategies to maximize success   RTC: 3 months for hypothyroidism  Nolon Nations  MSN, FNP-C Patient Care [facility] [address] [place], [place] [address] [phone]

## 2017-11-26 NOTE — Patient Instructions (Signed)
Hypothyroidism Hypothyroidism is a disorder of the thyroid. The thyroid is a large gland that is located in the lower front of the neck. The thyroid releases hormones that control how the body works. With hypothyroidism, the thyroid does not make enough of these hormones. What are the causes? Causes of hypothyroidism may include:  Viral infections.  Pregnancy.  Your own defense system (immune system) attacking your thyroid.  Certain medicines.  Birth defects.  Past radiation treatments to your head or neck.  Past treatment with radioactive iodine.  Past surgical removal of part or all of your thyroid.  Problems with the gland that is located in the center of your brain (pituitary).  What are the signs or symptoms? Signs and symptoms of hypothyroidism may include:  Feeling as though you have no energy (lethargy).  Inability to tolerate cold.  Weight gain that is not explained by a change in diet or exercise habits.  Dry skin.  Coarse hair.  Menstrual irregularity.  Slowing of thought processes.  Constipation.  Sadness or depression.  How is this diagnosed? Your health care provider may diagnose hypothyroidism with blood tests and ultrasound tests. How is this treated? Hypothyroidism is treated with medicine that replaces the hormones that your body does not make. After you begin treatment, it may take several weeks for symptoms to go away. Follow these instructions at home:  Take medicines only as directed by your health care provider.  If you start taking any new medicines, tell your health care provider.  Keep all follow-up visits as directed by your health care provider. This is important. As your condition improves, your dosage needs may change. You will need to have blood tests regularly so that your health care provider can watch your condition. Contact a health care provider if:  Your symptoms do not get better with treatment.  You are taking thyroid  replacement medicine and: ? You sweat excessively. ? You have tremors. ? You feel anxious. ? You lose weight rapidly. ? You cannot tolerate heat. ? You have emotional swings. ? You have diarrhea. ? You feel weak. Get help right away if:  You develop chest pain.  You develop an irregular heartbeat.  You develop a rapid heartbeat. This information is not intended to replace advice given to you by your health care provider. Make sure you discuss any questions you have with your health care provider. Document Released: 06/22/2005 Document Revised: 11/28/2015 Document Reviewed: 11/07/2013 Elsevier Interactive Patient Education  2018 Elsevier Inc.  

## 2017-11-27 LAB — THYROID PANEL WITH TSH
Free Thyroxine Index: 0.5 — ABNORMAL LOW (ref 1.2–4.9)
T3 UPTAKE RATIO: 21 % — AB (ref 24–39)
T4, Total: 2.5 ug/dL — ABNORMAL LOW (ref 4.5–12.0)
TSH: 103.6 u[IU]/mL — ABNORMAL HIGH (ref 0.450–4.500)

## 2017-11-30 ENCOUNTER — Other Ambulatory Visit: Payer: Self-pay | Admitting: Family Medicine

## 2017-11-30 ENCOUNTER — Telehealth: Payer: Self-pay

## 2017-11-30 DIAGNOSIS — E039 Hypothyroidism, unspecified: Secondary | ICD-10-CM

## 2017-11-30 MED ORDER — LEVOTHYROXINE SODIUM 112 MCG PO TABS: ORAL_TABLET | ORAL | 1 refills | Status: DC

## 2017-11-30 NOTE — Telephone Encounter (Signed)
-----   Message from Massie Maroon, [place] sent at 11/30/2017  5:54 AM EDT ----- Regarding: lab results Please inform patient that TSH is markedly elevated on current dose of levothyroxine. Will increase to 112 mcg daily prior to breakfast. Please scheduled lab appointment in 1 month.   Nolon Nations  MSN, FNP-C Patient Care [facility] [address] [place], [place] [address] [phone]

## 2017-11-30 NOTE — Telephone Encounter (Signed)
Called and spoke with patient, advised of tsh being out of range and the need to change current dosage to 112 mcg. Patient verbalized understanding and was scheduled for 1 month for re-draw. Thanks!

## 2017-11-30 NOTE — Progress Notes (Signed)
Meds ordered this encounter  Medications  . levothyroxine (SYNTHROID, LEVOTHROID) 112 MCG tablet    Sig: TAKE 1 TABLET BY MOUTH EVERY DAY BEFORE BREAKFAST    Dispense:  30 tablet    Refill:  1    Nolon Nations  MSN, FNP-C Patient Care [facility] [address] [place], [place] [address] [phone]

## 2017-11-30 NOTE — Progress Notes (Signed)
Orders Placed This Encounter  Procedures  . Thyroid Panel With TSH    Standing Status:   Future    Standing Expiration Date:   12/01/2018    Nolon Nations  MSN, FNP-C Patient Care [facility] [address] [place], [place] [address] [phone]

## 2017-12-24 ENCOUNTER — Other Ambulatory Visit: Payer: Self-pay | Admitting: Family Medicine

## 2017-12-24 DIAGNOSIS — M5442 Lumbago with sciatica, left side: Principal | ICD-10-CM

## 2017-12-24 DIAGNOSIS — G8929 Other chronic pain: Secondary | ICD-10-CM

## 2017-12-31 ENCOUNTER — Telehealth: Payer: Self-pay

## 2017-12-31 ENCOUNTER — Other Ambulatory Visit: Payer: Medicaid Other

## 2017-12-31 DIAGNOSIS — E039 Hypothyroidism, unspecified: Secondary | ICD-10-CM

## 2018-01-01 LAB — THYROID PANEL WITH TSH
Free Thyroxine Index: 1.4 (ref 1.2–4.9)
T3 Uptake Ratio: 26 % (ref 24–39)
T4 TOTAL: 5.3 ug/dL (ref 4.5–12.0)
TSH: 37.15 u[IU]/mL — AB (ref 0.450–4.500)

## 2018-01-02 ENCOUNTER — Other Ambulatory Visit: Payer: Self-pay | Admitting: Family Medicine

## 2018-01-02 DIAGNOSIS — E039 Hypothyroidism, unspecified: Secondary | ICD-10-CM

## 2018-01-02 NOTE — Progress Notes (Signed)
Orders Placed This Encounter  Procedures  . Thyroid Panel With TSH    Standing Status:   Future    Standing Expiration Date:   01/03/2019    [name]  MSN, FNP-C Patient Care [facility] [address]  , [place] [address] [phone]

## 2018-01-03 ENCOUNTER — Telehealth: Payer: Self-pay

## 2018-01-03 NOTE — Telephone Encounter (Signed)
Called, no answer. Left a message for patient to call back and schedule a lab appointment for 6 weeks. Thanks!

## 2018-01-03 NOTE — Telephone Encounter (Signed)
-----   Message from Massie MaroonLachina M Hollis, [place] sent at 01/02/2018  9:29 AM EDT ----- Regarding: lab results Please inform patient that thyroid levels have improved significantly, will repeat thyroid panel in 6 weeks. Schedule lab appointment.   Nolon NationsLachina Moore Hollis  MSN, FNP-C Patient Care [facility] [address]  [place], [place] [id number] [phone]

## 2018-01-18 ENCOUNTER — Other Ambulatory Visit: Payer: Self-pay | Admitting: Family Medicine

## 2018-01-18 DIAGNOSIS — G8929 Other chronic pain: Secondary | ICD-10-CM

## 2018-01-18 DIAGNOSIS — M5442 Lumbago with sciatica, left side: Principal | ICD-10-CM

## 2018-02-14 ENCOUNTER — Other Ambulatory Visit: Payer: Medicaid Other

## 2018-02-21 ENCOUNTER — Other Ambulatory Visit: Payer: Medicaid Other

## 2018-02-21 DIAGNOSIS — E039 Hypothyroidism, unspecified: Secondary | ICD-10-CM

## 2018-02-22 LAB — THYROID PANEL WITH TSH
FREE THYROXINE INDEX: 1.7 (ref 1.2–4.9)
T3 Uptake Ratio: 28 % (ref 24–39)
T4, Total: 6.1 ug/dL (ref 4.5–12.0)
TSH: 4.4 u[IU]/mL (ref 0.450–4.500)

## 2018-02-25 ENCOUNTER — Other Ambulatory Visit: Payer: Self-pay

## 2018-02-25 DIAGNOSIS — G8929 Other chronic pain: Secondary | ICD-10-CM

## 2018-02-25 DIAGNOSIS — M5442 Lumbago with sciatica, left side: Principal | ICD-10-CM

## 2018-02-25 DIAGNOSIS — G629 Polyneuropathy, unspecified: Secondary | ICD-10-CM

## 2018-02-25 MED ORDER — GABAPENTIN 400 MG PO CAPS: ORAL_CAPSULE | ORAL | 0 refills | Status: DC

## 2018-02-28 ENCOUNTER — Ambulatory Visit: Payer: Medicaid Other | Admitting: Family Medicine

## 2018-02-28 ENCOUNTER — Encounter: Payer: Self-pay | Admitting: Family Medicine

## 2018-02-28 ENCOUNTER — Ambulatory Visit (INDEPENDENT_AMBULATORY_CARE_PROVIDER_SITE_OTHER): Payer: Medicaid Other | Admitting: Family Medicine

## 2018-02-28 VITALS — BP 99/71 | HR 62 | Temp 97.6°F | Resp 16 | Ht 69.0 in | Wt 153.0 lb

## 2018-02-28 DIAGNOSIS — B182 Chronic viral hepatitis C: Secondary | ICD-10-CM | POA: Diagnosis not present

## 2018-02-28 DIAGNOSIS — E039 Hypothyroidism, unspecified: Secondary | ICD-10-CM

## 2018-02-28 DIAGNOSIS — J449 Chronic obstructive pulmonary disease, unspecified: Secondary | ICD-10-CM | POA: Diagnosis not present

## 2018-02-28 DIAGNOSIS — M5442 Lumbago with sciatica, left side: Secondary | ICD-10-CM

## 2018-02-28 DIAGNOSIS — F17200 Nicotine dependence, unspecified, uncomplicated: Secondary | ICD-10-CM

## 2018-02-28 DIAGNOSIS — G8929 Other chronic pain: Secondary | ICD-10-CM

## 2018-02-28 MED ORDER — METHOCARBAMOL 500 MG PO TABS: 500.0000 mg | ORAL_TABLET | Freq: Four times a day (QID) | ORAL | 1 refills | Status: DC | PRN

## 2018-02-28 MED ORDER — LEVOTHYROXINE SODIUM 112 MCG PO TABS: 112.0000 ug | ORAL_TABLET | Freq: Every day | ORAL | 1 refills | Status: DC

## 2018-02-28 MED ORDER — ALBUTEROL SULFATE HFA 108 (90 BASE) MCG/ACT IN AERS: 2.0000 | INHALATION_SPRAY | Freq: Four times a day (QID) | RESPIRATORY_TRACT | 11 refills | Status: DC | PRN

## 2018-02-28 MED ORDER — BUDESONIDE-FORMOTEROL FUMARATE 80-4.5 MCG/ACT IN AERO: 2.0000 | INHALATION_SPRAY | Freq: Two times a day (BID) | RESPIRATORY_TRACT | 11 refills | Status: DC

## 2018-02-28 MED ORDER — GABAPENTIN 400 MG PO CAPS: ORAL_CAPSULE | ORAL | 5 refills | Status: DC

## 2018-02-28 NOTE — Progress Notes (Signed)
[facility]  Chronic Disease Follow Up Provider: Mike Gip, FNP  SUBJECTIVE:  Patient presents for follow up for the following  chronic conditions. Patient  has a past medical history of Bradycardia, Chronic back pain, Hepatitis C, chronic (HCC), Hypothyroidism, and Tobacco dependence.   Hypothyroidism:  Patient states that he is doing well on his medications. Denies side effects of medication.Reports dry skin. No other endocrine symptoms.   COPD: Patient displays the symptoms of COPD: no.   Tobacco Use: Patient continues to smoke 1/2 ppd. Would like to quit in the future. Not ready today.   hx of Hep C. Was given meds x 8 weeks. Patient reports completing the course.   Review of Systems  Constitutional: Negative.   HENT: Negative.   Eyes: Negative.   Respiratory: Negative.   Cardiovascular: Negative.   Gastrointestinal: Negative.   Genitourinary: Negative.   Musculoskeletal: Negative.   Skin: Negative.   Neurological: Negative.   Psychiatric/Behavioral: Negative.     OBJECTIVE:  BP 99/71 (BP Location: Left Arm, Patient Position: Sitting, Cuff Size: Normal)   Pulse 62   Temp 97.6 F (36.4 C) (Oral)   Resp 16   Ht 5\' 9"  (1.753 m)   Wt 153 lb (69.4 kg)   SpO2 97%   BMI 22.59 kg/m   Physical Exam  Constitutional: He is oriented to person, place, and time and well-developed, well-nourished, and in no distress. No distress.  HENT:  Head: Normocephalic and atraumatic.  Eyes: Pupils are equal, round, and reactive to light. Conjunctivae and EOM are normal.  Neck: Normal range of motion. Neck supple.  Cardiovascular: Normal rate, regular rhythm and intact distal pulses. Exam reveals no gallop and no friction rub.  No murmur heard. Pulmonary/Chest: Effort normal and breath sounds normal. No respiratory distress. He has no wheezes.  Abdominal: Soft. Bowel sounds are normal. There is no tenderness.  Musculoskeletal:  Normal range of motion. He exhibits no edema or tenderness.  Lymphadenopathy:    He has no cervical adenopathy.  Neurological: He is alert and oriented to person, place, and time. Gait normal.  Skin: Skin is warm and dry.  Psychiatric: Mood, memory, affect and judgment normal.  Nursing note and vitals reviewed.    ASSESSMENT/PLAN:  1. Chronic obstructive pulmonary disease, unspecified COPD type (HCC) The current medical regimen is effective;  continue present plan and medications. - albuterol (PROVENTIL HFA;VENTOLIN HFA) 108 (90 Base) MCG/ACT inhaler; Inhale 2 puffs into the lungs every 6 (six) hours as needed for wheezing or shortness of breath.  Dispense: 1 Inhaler; Refill: 11 - budesonide-formoterol (SYMBICORT) 80-4.5 MCG/ACT inhaler; Inhale 2 puffs into the lungs 2 (two) times daily.  Dispense: 1 Inhaler; Refill: 11  2. Chronic hepatitis C without hepatic coma (HCC) Labs ordered - CBC with Differential - Comprehensive metabolic panel  3. Acquired hypothyroidism Normal labs on 02/21/2018.  The current medical regimen is effective;  continue present plan and medications. - levothyroxine (SYNTHROID, LEVOTHROID) 112 MCG tablet; Take 1 tablet (112 mcg total) by mouth daily before breakfast.  Dispense: 90 tablet; Refill: 1  4. Tobacco dependence The patient was counseled on the dangers of tobacco use, and was advised to quit and reluctant to quit.  Reviewed strategies to maximize success, including stress management, substitution of other forms of reinforcement, support of family/friends and written materials.  5. Chronic left-sided low back pain with left-sided sciatica The current medical regimen is effective;  continue present plan and medications. -  methocarbamol (ROBAXIN) 500 MG tablet; Take 1 tablet (500 mg total) by mouth every 6 (six) hours as needed for muscle spasms.  Dispense: 40 tablet; Refill: 1 - gabapentin (NEURONTIN) 400 MG capsule; TAKE 1 CAPSULE(400 MG) BY MOUTH THREE  TIMES DAILY  Dispense: 90 capsule; Refill: 5  Return to care as scheduled and prn. Patient verbalized understanding and agreed with plan of care.   [name], FNP-BC [facility] [address]  [place], [place] [id number] [phone]

## 2018-02-28 NOTE — Patient Instructions (Signed)
Chronic Obstructive Pulmonary Disease Chronic obstructive pulmonary disease (COPD) is a long-term (chronic) lung problem. When you have COPD, it is hard for air to get in and out of your lungs. The way your lungs work will never return to normal. Usually the condition gets worse over time. There are things you can do to keep yourself as healthy as possible. Your doctor may treat your condition with:  Medicines.  Quitting smoking, if you smoke.  Rehabilitation. This may involve a team of specialists.  Oxygen.  Exercise and changes to your diet.  Lung surgery.  Comfort measures (palliative care).  Follow these instructions at home: Medicines  Take over-the-counter and prescription medicines only as told by your doctor.  Talk to your doctor before taking any cough or allergy medicines. You may need to avoid medicines that cause your lungs to be dry. Lifestyle  If you smoke, stop. Smoking makes the problem worse. If you need help quitting, ask your doctor.  Avoid being around things that make your breathing worse. This may include smoke, chemicals, and fumes.  Stay active, but remember to also rest.  Learn and use tips on how to relax.  Make sure you get enough sleep. Most adults need at least 7 hours a night.  Eat healthy foods. Eat smaller meals more often. Rest before meals. Controlled breathing  Learn and use tips on how to control your breathing as told by your doctor. Try: ? Breathing in (inhaling) through your nose for 1 second. Then, pucker your lips and breath out (exhale) through your lips for 2 seconds. ? Putting one hand on your belly (abdomen). Breathe in slowly through your nose for 1 second. Your hand on your belly should move out. Pucker your lips and breathe out slowly through your lips. Your hand on your belly should move in as you breathe out. Controlled coughing  Learn and use controlled coughing to clear mucus from your lungs. The steps are: 1. Lean your  head a little forward. 2. Breathe in deeply. 3. Try to hold your breath for 3 seconds. 4. Keep your mouth slightly open while coughing 2 times. 5. Spit any mucus out into a tissue. 6. Rest and do the steps again 1 or 2 times as needed. General instructions  Make sure you get all the shots (vaccines) that your doctor recommends. Ask your doctor about a flu shot and a pneumonia shot.  Use oxygen therapy and therapy to help improve your lungs (pulmonary rehabilitation) if told by your doctor. If you need home oxygen therapy, ask your doctor if you should buy a tool to measure your oxygen level (oximeter).  Make a COPD action plan with your doctor. This helps you know what to do if you feel worse than usual.  Manage any other conditions you have as told by your doctor.  Avoid going outside when it is very hot, cold, or humid.  Avoid people who have a sickness you can catch (contagious).  Keep all follow-up visits as told by your doctor. This is important. Contact a doctor if:  You cough up more mucus than usual.  There is a change in the color or thickness of the mucus.  It is harder to breathe than usual.  Your breathing is faster than usual.  You have trouble sleeping.  You need to use your medicines more often than usual.  You have trouble doing your normal activities such as getting dressed or walking around the house. Get help right away if:    You have shortness of breath while resting.  You have shortness of breath that stops you from: ? Being able to talk. ? Doing normal activities.  Your chest hurts for longer than 5 minutes.  Your skin color is more blue than usual.  Your pulse oximeter shows that you have low oxygen for longer than 5 minutes.  You have a fever.  You feel too tired to breathe normally. Summary  Chronic obstructive pulmonary disease (COPD) is a long-term lung problem.  The way your lungs work will never return to normal. Usually the  condition gets worse over time. There are things you can do to keep yourself as healthy as possible.  Take over-the-counter and prescription medicines only as told by your doctor.  If you smoke, stop. Smoking makes the problem worse. This information is not intended to replace advice given to you by your health care provider. Make sure you discuss any questions you have with your health care provider. Document Released: 12/09/2007 Document Revised: 11/28/2015 Document Reviewed: 02/16/2013 Elsevier Interactive Patient Education  2017 [employer]  Coping with Quitting Smoking Quitting smoking is a physical and mental challenge. You will face cravings, withdrawal symptoms, and temptation. Before quitting, work with your health care provider to make a plan that can help you cope. Preparation can help you quit and keep you from giving in. How can I cope with cravings? Cravings usually last for 5-10 minutes. If you get through it, the craving will pass. Consider taking the following actions to help you cope with cravings:  Keep your mouth busy: ? Chew sugar-free gum. ? Suck on hard candies or a straw. ? Brush your teeth.  Keep your hands and body busy: ? Immediately change to a different activity when you feel a craving. ? Squeeze or play with a ball. ? Do an activity or a hobby, like making bead jewelry, practicing needlepoint, or working with wood. ? Mix up your normal routine. ? Take a short exercise break. Go for a quick walk or run up and down stairs. ? Spend time in public places where smoking is not allowed.  Focus on doing something kind or helpful for someone else.  Call a friend or family member to talk during a craving.  Join a support group.  Call a quit line, such as 1-800-QUIT-NOW.  Talk with your health care provider about medicines that might help you cope with cravings and make quitting easier for you.  How can I deal with withdrawal symptoms? Your body may  experience negative effects as it tries to get used to not having nicotine in the system. These effects are called withdrawal symptoms. They may include:  Feeling hungrier than normal.  Trouble concentrating.  Irritability.  Trouble sleeping.  Feeling depressed.  Restlessness and agitation.  Craving a cigarette.  To manage withdrawal symptoms:  Avoid places, people, and activities that trigger your cravings.  Remember why you want to quit.  Get plenty of sleep.  Avoid coffee and other caffeinated drinks. These may worsen some of your symptoms.  How can I handle social situations? Social situations can be difficult when you are quitting smoking, especially in the first few weeks. To manage this, you can:  Avoid parties, bars, and other social situations where people might be smoking.  Avoid alcohol.  Leave right away if you have the urge to smoke.  Explain to your family and friends that you are quitting smoking. Ask for understanding and support.  Plan activities with friends   family where smoking is not an option.  What are some ways I can cope with stress? Wanting to smoke may cause stress, and stress can make you want to smoke. Find ways to manage your stress. Relaxation techniques can help. For example:  Breathe slowly and deeply, in through your nose and out through your mouth.  Listen to soothing, relaxing music.  Talk with a family member or friend about your stress.  Light a candle.  Soak in a bath or take a shower.  Think about a peaceful place.  What are some ways I can prevent weight gain? Be aware that many people gain weight after they quit smoking. However, not everyone does. To keep from gaining weight, have a plan in place before you quit and stick to the plan after you quit. Your plan should include:  Having healthy snacks. When you have a craving, it may help to: ? Eat plain popcorn, crunchy carrots, celery, or other cut  vegetables. ? Chew sugar-free gum.  Changing how you eat: ? Eat small portion sizes at meals. ? Eat 4-6 small meals throughout the day instead of 1-2 large meals a day. ? Be mindful when you eat. Do not watch television or do other things that might distract you as you eat.  Exercising regularly: ? Make time to exercise each day. If you do not have time for a long workout, do short bouts of exercise for 5-10 minutes several times a day. ? Do some form of strengthening exercise, like weight lifting, and some form of aerobic exercise, like running or swimming.  Drinking plenty of water or other low-calorie or no-calorie drinks. Drink 6-8 glasses of water daily, or as much as instructed by your health care provider.  Summary  Quitting smoking is a physical and mental challenge. You will face cravings, withdrawal symptoms, and temptation to smoke again. Preparation can help you as you go through these challenges.  You can cope with cravings by keeping your mouth busy (such as by chewing gum), keeping your body and hands busy, and making calls to family, friends, or a helpline for people who want to quit smoking.  You can cope with withdrawal symptoms by avoiding places where people smoke, avoiding drinks with caffeine, and getting plenty of rest.  Ask your health care provider about the different ways to prevent weight gain, avoid stress, and handle social situations. This information is not intended to replace advice given to you by your health care provider. Make sure you discuss any questions you have with your health care provider. Document Released: 06/19/2016 Document Revised: 06/19/2016 Document Reviewed: 06/19/2016 Elsevier Interactive Patient Education  [employer]2018 Else[employer] Inc.

## 2018-03-01 LAB — COMPREHENSIVE METABOLIC PANEL
ALT: 5 IU/L (ref 0–44)
AST: 15 IU/L (ref 0–40)
Albumin/Globulin Ratio: 1.3 (ref 1.2–2.2)
Albumin: 4 g/dL (ref 3.5–5.5)
Alkaline Phosphatase: 119 IU/L — ABNORMAL HIGH (ref 39–117)
BUN/Creatinine Ratio: 15 (ref 9–20)
BUN: 15 mg/dL (ref 6–24)
Bilirubin Total: 0.2 mg/dL (ref 0.0–1.2)
CO2: 23 mmol/L (ref 20–29)
Calcium: 9.5 mg/dL (ref 8.7–10.2)
Chloride: 101 mmol/L (ref 96–106)
Creatinine, Ser: 1.02 mg/dL (ref 0.76–1.27)
GFR calc Af Amer: 93 mL/min/{1.73_m2} (ref >59)
GFR calc non Af Amer: 80 mL/min/{1.73_m2} (ref >59)
Globulin, Total: 3.2 g/dL (ref 1.5–4.5)
Glucose: 95 mg/dL (ref 65–99)
Potassium: 4.3 mmol/L (ref 3.5–5.2)
Sodium: 138 mmol/L (ref 134–144)
Total Protein: 7.2 g/dL (ref 6.0–8.5)

## 2018-03-01 LAB — CBC WITH DIFFERENTIAL/PLATELET
Basophils Absolute: 0.1 10*3/uL (ref 0.0–0.2)
Basos: 1 %
EOS (ABSOLUTE): 0.7 10*3/uL — ABNORMAL HIGH (ref 0.0–0.4)
Eos: 8 %
Hematocrit: 41.2 % (ref 37.5–51.0)
Hemoglobin: 13.5 g/dL (ref 13.0–17.7)
Immature Grans (Abs): 0 10*3/uL (ref 0.0–0.1)
Immature Granulocytes: 0 %
Lymphocytes Absolute: 2.4 10*3/uL (ref 0.7–3.1)
Lymphs: 26 %
MCH: 28.8 pg (ref 26.6–33.0)
MCHC: 32.8 g/dL (ref 31.5–35.7)
MCV: 88 fL (ref 79–97)
Monocytes Absolute: 0.7 10*3/uL (ref 0.1–0.9)
Monocytes: 8 %
Neutrophils Absolute: 5.1 10*3/uL (ref 1.4–7.0)
Neutrophils: 57 %
Platelets: 321 10*3/uL (ref 150–450)
RBC: 4.68 x10E6/uL (ref 4.14–5.80)
RDW: 15.8 % — ABNORMAL HIGH (ref 12.3–15.4)
WBC: 9 10*3/uL (ref 3.4–10.8)

## 2018-05-11 DIAGNOSIS — M419 Scoliosis, unspecified: Secondary | ICD-10-CM | POA: Diagnosis not present

## 2018-05-13 DIAGNOSIS — H40033 Anatomical narrow angle, bilateral: Secondary | ICD-10-CM | POA: Diagnosis not present

## 2018-05-13 DIAGNOSIS — H2513 Age-related nuclear cataract, bilateral: Secondary | ICD-10-CM | POA: Diagnosis not present

## 2018-05-27 IMAGING — CT CT L SPINE W/O CM
3 series · 13 of 33 positions shown, 16 images · non-contrast
Comparison: MRI dated 07/20/2017

CLINICAL DATA: Degenerative scoliosis.

EXAM:
CT LUMBAR SPINE WITHOUT CONTRAST
TECHNIQUE: Multidetector CT imaging of the lumbar spine was performed without
intravenous contrast administration using the operative robotics
protocol. Multiplanar CT image reconstructions were also generated.

[Series 4: l-spine adult 2.0 · axial · 0.32mm/px · z∈[+928,+1088]mm · 5 of 116 slices shown, 7 images]
[im 18/116  soft-tissue]
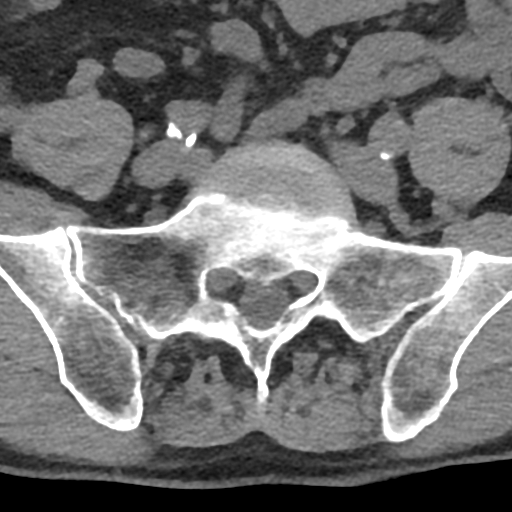
[im 18/116  bone]
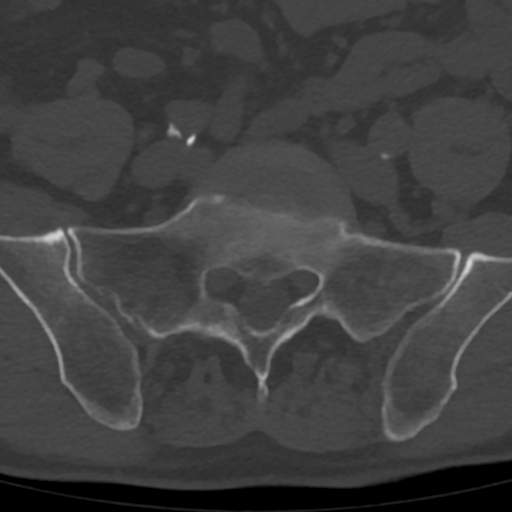
[im 36/116  bone]
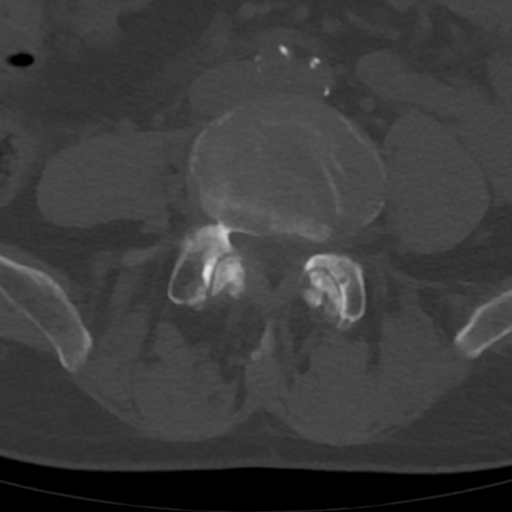
[im 62/116  bone]
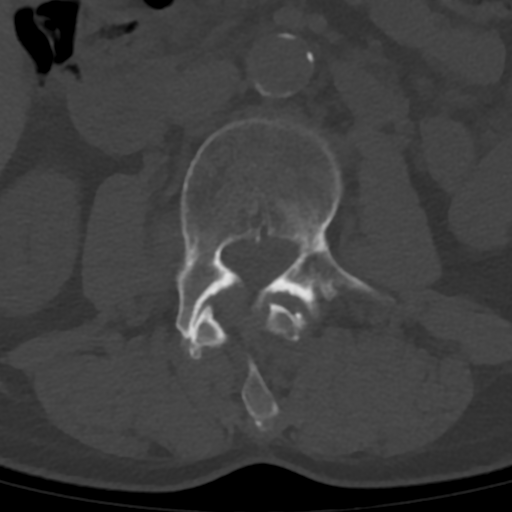
[im 80/116  bone]
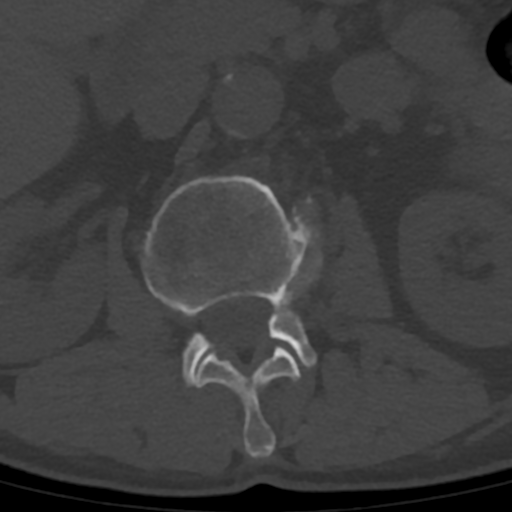
[im 98/116  soft-tissue]
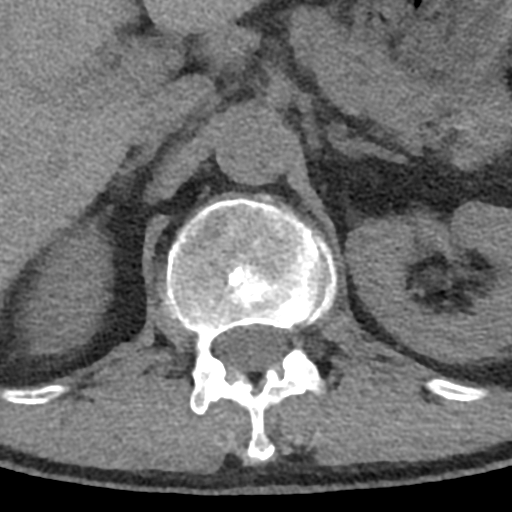
[im 98/116  bone]
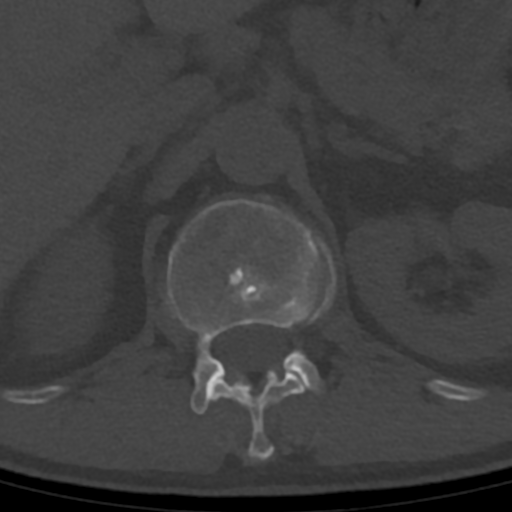

[Series 7: cor st · coronal · 0.38mm/px · 3 of 74 slices shown]
[im 15/74  bone]
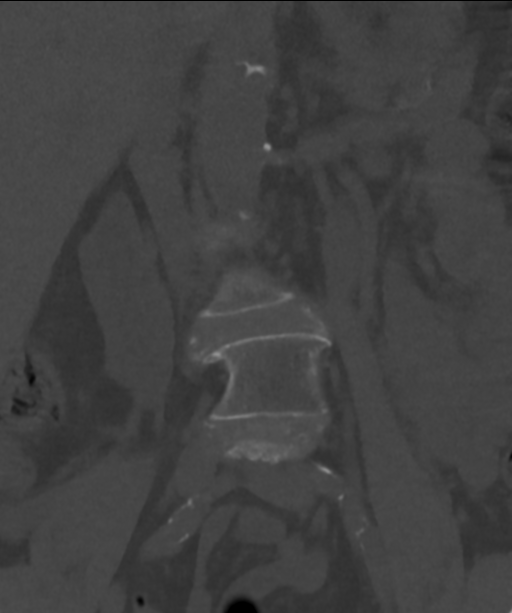
[im 30/74  bone]
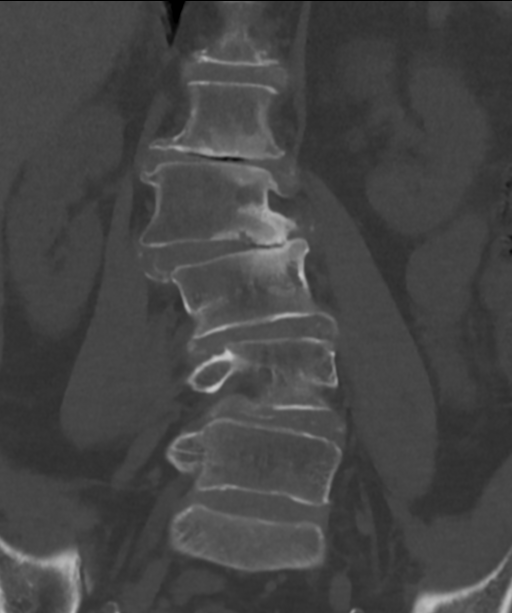
[im 44/74  bone]
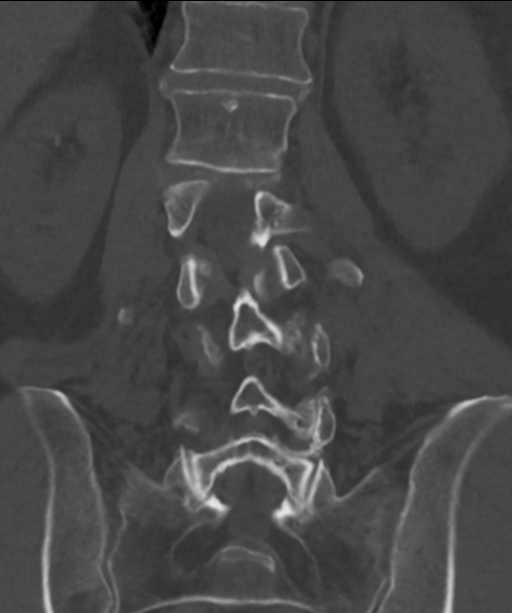

[Series 9: sagittal st · sagittal · 0.34mm/px · 5 of 99 slices shown, 6 images]
[im 33/99  bone]
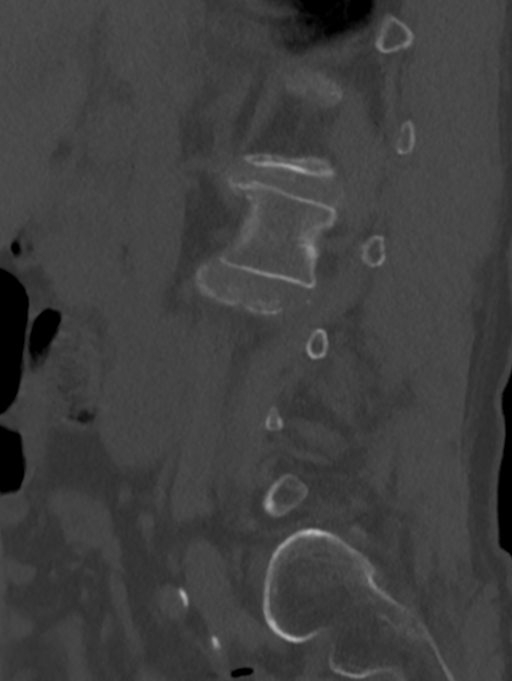
[im 41/99  bone]
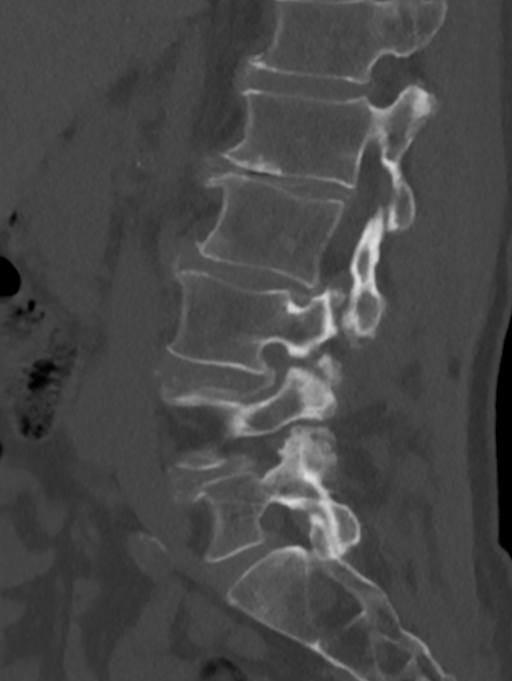
[im 50/99  soft-tissue]
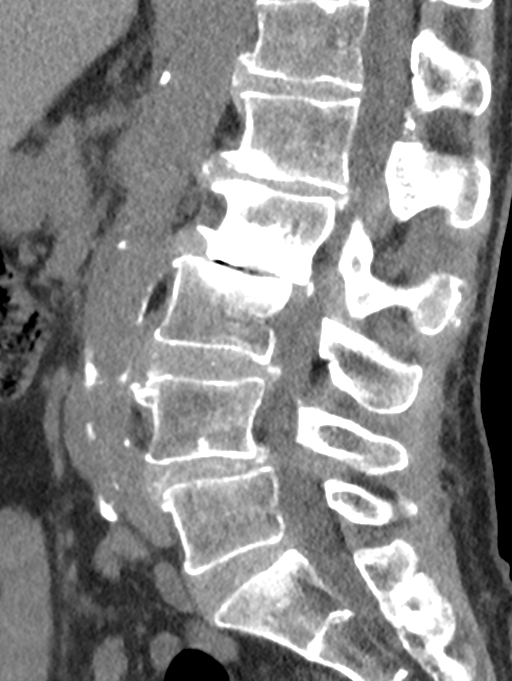
[im 50/99  bone]
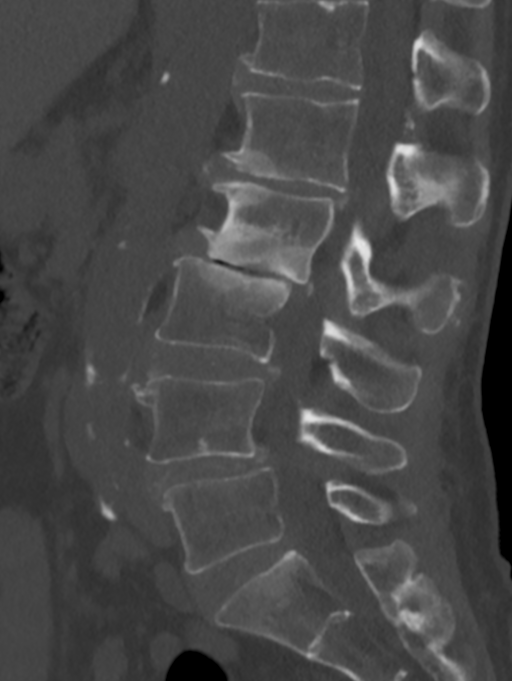
[im 58/99  bone]
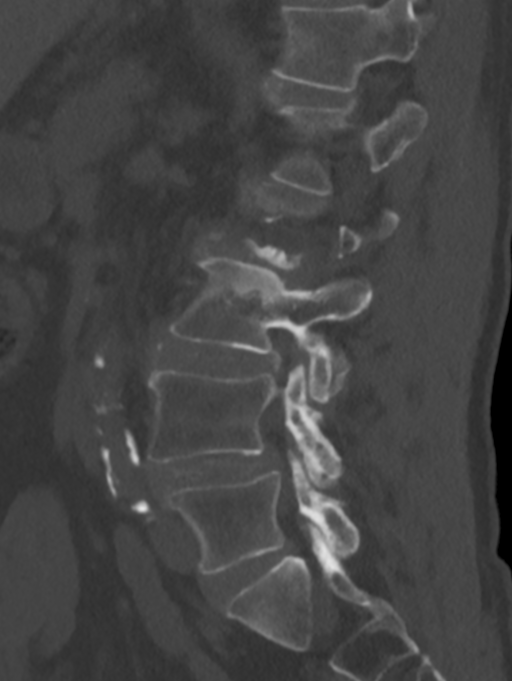
[im 66/99  bone]
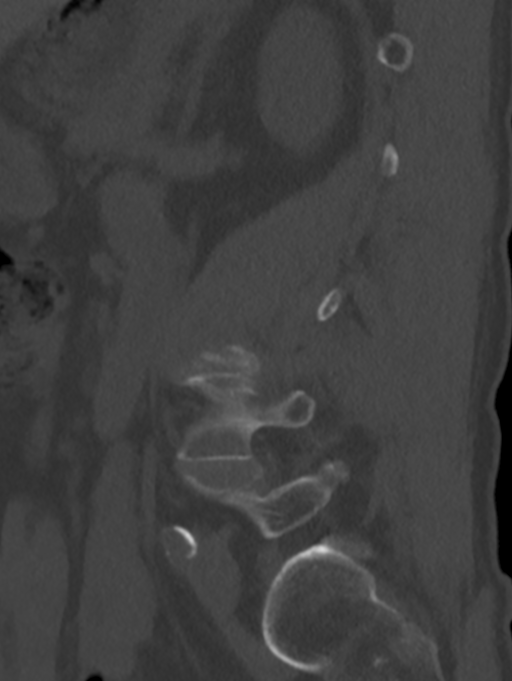

[13 of 33 positions shown; findings below may reference images not displayed]

FINDINGS: Segmentation: 5 lumbar type vertebrae.

Alignment: Compound lumbar scoliosis. Slight retrolisthesis at L1-2.
5 mm retrolisthesis at L2-3. 4 mm anterolisthesis at L4-5.

Vertebrae: No fractures or bone destruction.

Paraspinal and other soft tissues: Aortic atherosclerosis. Small
right renal calculi. No hydronephrosis.

Disc levels: T12-L1: No significant abnormality.

L1-2: Disc space narrowing with a small disc protrusion central and
to the left slightly compressing the left side of the thecal sac.
Minimal degenerative changes of the facet joints.

L2-3: Retrolisthesis. Broad-based protrusion of the uncovered disc
extending into the left neural foramen with severe left foraminal
stenosis and moderate right foraminal stenosis. Moderate spinal
stenosis. Bilateral facet arthritis, right more than left.

L3-4: Broad-based disc bulge with hypertrophy of the ligamentum
flavum creating mild spinal stenosis and bilateral lateral recess
stenosis, right greater than left. Moderate bilateral facet
arthritis.

L4-5: Severe bilateral facet arthritis with hypertrophy of the
ligamentum flavum with a protrusion into the right neural foramen
which probably affects the right L4 nerve, best seen on image 78 of
series 4. 4 mm spondylolisthesis.

L5-S1: Broad-based bulge of the disc without focal neural
impingement. Slight bilateral facet arthritis.
IMPRESSION: 1. Multilevel degenerative disc and joint disease with spinal
stenosis at L2-3, L3-4, and L4-5.
2. Right foraminal disc protrusion at L4-5 which could affect the
right L4 nerve.
3. Bilateral lateral recess compression at L3-4, right more than
left.
4. Severe left foraminal stenosis at L2-3.

## 2018-05-31 DIAGNOSIS — H5213 Myopia, bilateral: Secondary | ICD-10-CM | POA: Diagnosis not present

## 2018-06-10 IMAGING — RF DG C-ARM 61-120 MIN
1 series · 2 of 2 positions shown · non-contrast
Comparison: CT lumbar spine August 30, 2017

CLINICAL DATA: Lumbar spine surgery.

EXAM:
DG C-ARM 61-120 MIN; LUMBAR SPINE - 2-3 VIEW
FLUOROSCOPY TIME:  300 seconds.

[Series 1: run · 2 of 2 slices shown]
[im 1/2]
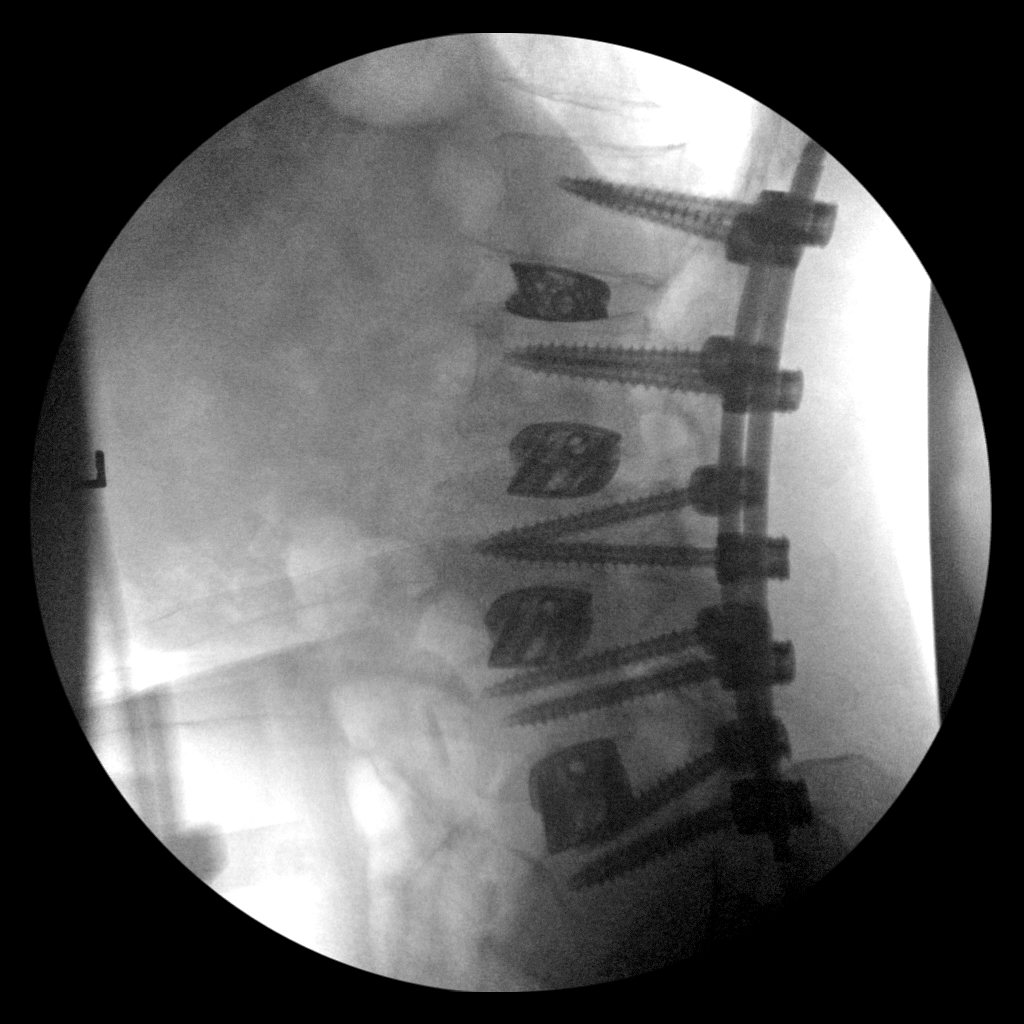
[im 2/2]
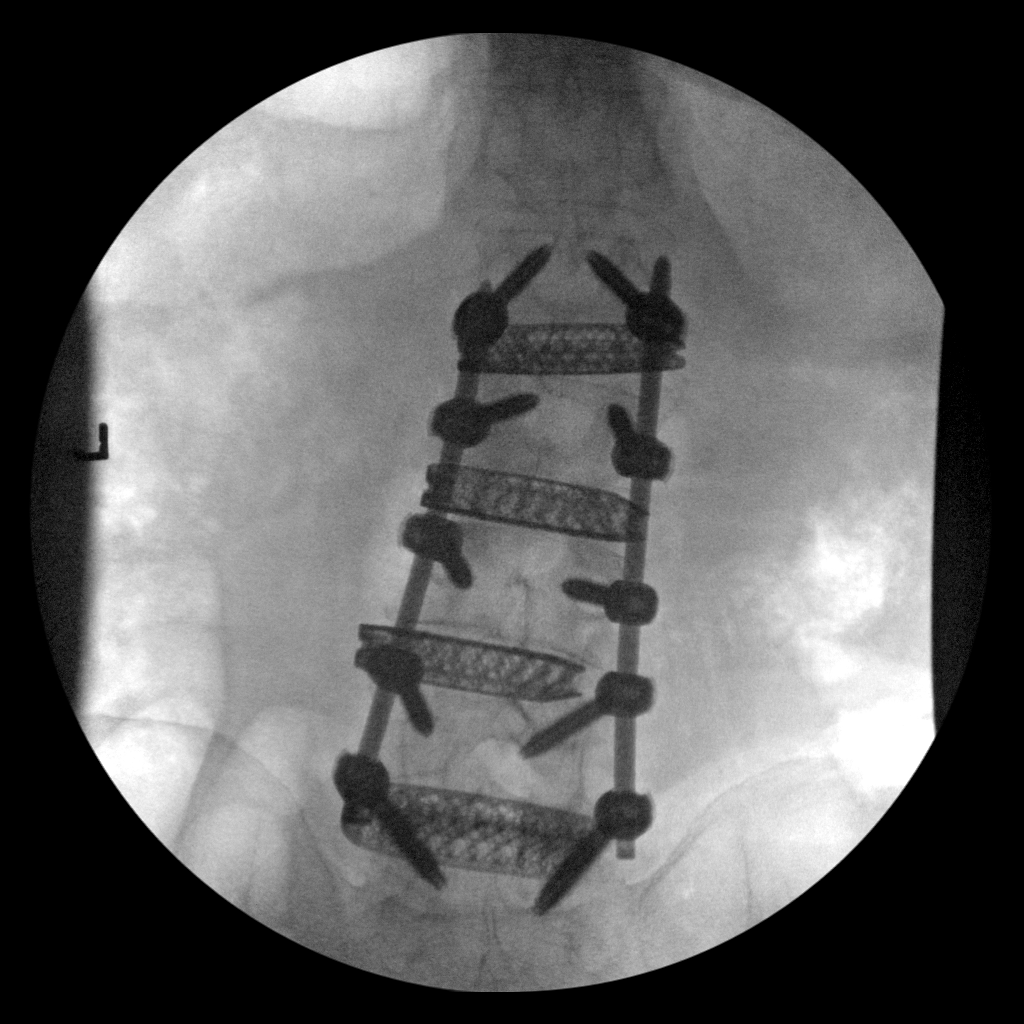

[2 of 2 positions shown; findings below may reference images not displayed]

FINDINGS: Two intraoperative fluoroscopic spot views of the lumbar spine,
interpreting radiologist was not present at time of operation. L1
through L5 PLIF with disc prosthesis.
IMPRESSION: Intraoperative fluoroscopic spot views documenting L1 through L5
PLIF.

## 2018-06-11 IMAGING — CR DG LUMBAR SPINE 2-3V
2 series · 2 of 2 positions shown · non-contrast
Comparison: Intraoperative radiographs performed 09/13/2017

CLINICAL DATA: Postoperative radiograph, status post lumbar spinal
fusion.

EXAM:
LUMBAR SPINE - 2-3 VIEW

[l-spine ap]
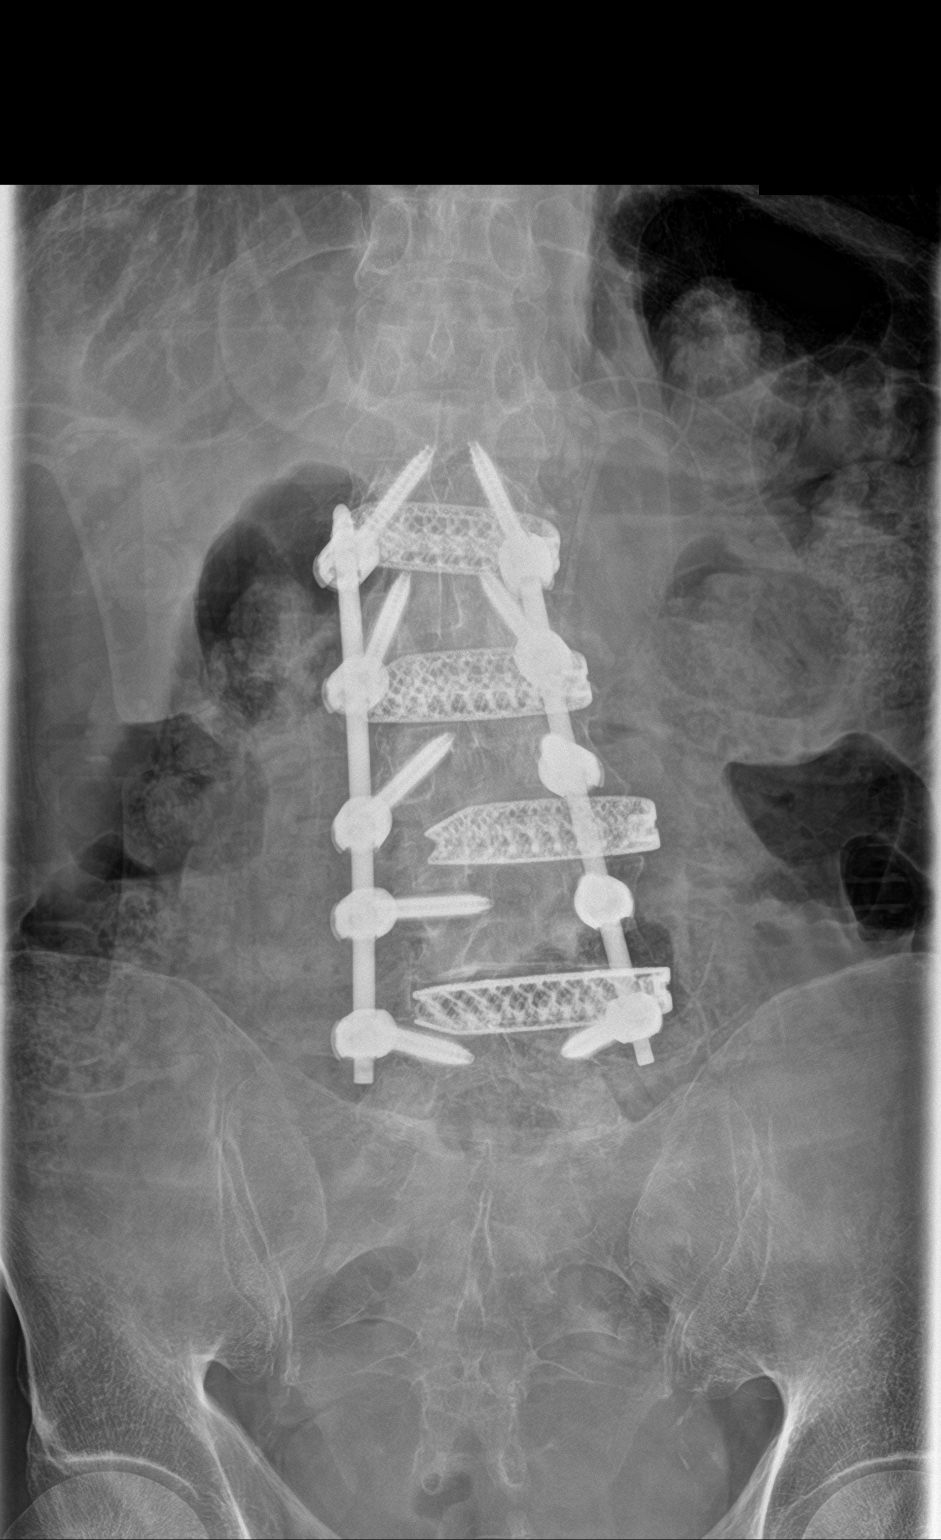

[l-spine lat]
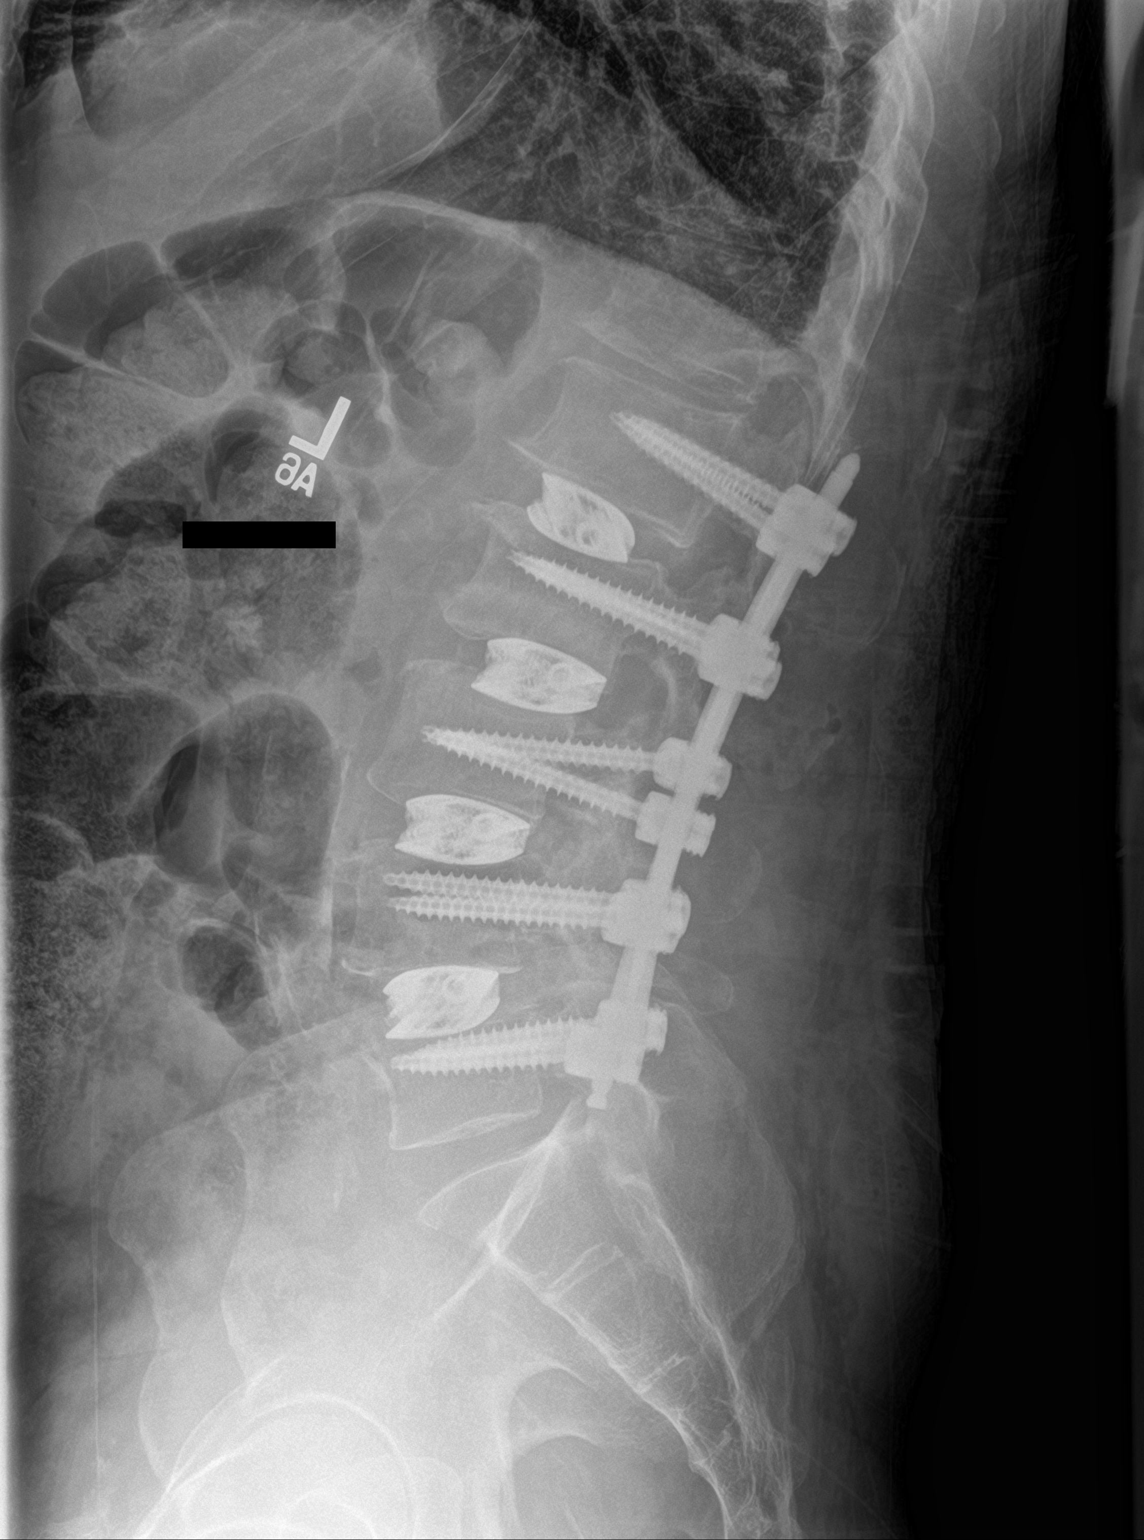

[2 of 2 positions shown; findings below may reference images not displayed]

FINDINGS: The patient is status post lumbar spinal fusion at L1-L5, with
associated disc prostheses. The L3-L4 disc prosthesis is positioned
slightly to the left of midline, though this corresponds to some
extent with vertebral body position. Intervertebral disc spaces are
otherwise preserved.

The visualized bowel gas pattern is grossly unremarkable.
IMPRESSION: Status post lumbar spinal fusion at L1-L5, with associated disc
prostheses.

## 2018-08-31 ENCOUNTER — Ambulatory Visit (INDEPENDENT_AMBULATORY_CARE_PROVIDER_SITE_OTHER): Payer: Medicaid Other | Admitting: Family Medicine

## 2018-08-31 ENCOUNTER — Encounter: Payer: Self-pay | Admitting: Family Medicine

## 2018-08-31 VITALS — BP 123/71 | HR 67 | Temp 98.2°F | Resp 14 | Ht 69.0 in | Wt 161.0 lb

## 2018-08-31 DIAGNOSIS — M0580 Other rheumatoid arthritis with rheumatoid factor of unspecified site: Secondary | ICD-10-CM | POA: Diagnosis not present

## 2018-08-31 DIAGNOSIS — E039 Hypothyroidism, unspecified: Secondary | ICD-10-CM

## 2018-08-31 DIAGNOSIS — M2550 Pain in unspecified joint: Secondary | ICD-10-CM

## 2018-08-31 NOTE — Patient Instructions (Signed)
Hypothyroidism  Hypothyroidism is when the thyroid gland does not make enough of certain hormones (it is underactive). The thyroid gland is a small gland located in the lower front part of the neck, just in front of the windpipe (trachea). This gland makes hormones that help control how the body uses food for energy (metabolism) as well as how the heart and brain function. These hormones also play a role in keeping your bones strong. When the thyroid is underactive, it produces too little of the hormones thyroxine (T4) and triiodothyronine (T3). What are the causes? This condition may be caused by:  Hashimoto's disease. This is a disease in which the body's disease-fighting system (immune system) attacks the thyroid gland. This is the most common cause.  Viral infections.  Pregnancy.  Certain medicines.  Birth defects.  Past radiation treatments to the head or neck for cancer.  Past treatment with radioactive iodine.  Past exposure to radiation in the environment.  Past surgical removal of part or all of the thyroid.  Problems with a gland in the center of the brain (pituitary gland).  Lack of enough iodine in the diet. What increases the risk? You are more likely to develop this condition if:  You are male.  You have a family history of thyroid conditions.  You use a medicine called lithium.  You take medicines that affect the immune system (immunosuppressants). What are the signs or symptoms? Symptoms of this condition include:  Feeling as though you have no energy (lethargy).  Not being able to tolerate cold.  Weight gain that is not explained by a change in diet or exercise habits.  Lack of appetite.  Dry skin.  Coarse hair.  Menstrual irregularity.  Slowing of thought processes.  Constipation.  Sadness or depression. How is this diagnosed? This condition may be diagnosed based on:  Your symptoms, your medical history, and a physical exam.  Blood  tests. You may also have imaging tests, such as an ultrasound or MRI. How is this treated? This condition is treated with medicine that replaces the thyroid hormones that your body does not make. After you begin treatment, it may take several weeks for symptoms to go away. Follow these instructions at home:  Take over-the-counter and prescription medicines only as told by your health care provider.  If you start taking any new medicines, tell your health care provider.  Keep all follow-up visits as told by your health care provider. This is important. ? As your condition improves, your dosage of thyroid hormone medicine may change. ? You will need to have blood tests regularly so that your health care provider can monitor your condition. Contact a health care provider if:  Your symptoms do not get better with treatment.  You are taking thyroid replacement medicine and you: ? Sweat a lot. ? Have tremors. ? Feel anxious. ? Lose weight rapidly. ? Cannot tolerate heat. ? Have emotional swings. ? Have diarrhea. ? Feel weak. Get help right away if you have:  Chest pain.  An irregular heartbeat.  A rapid heartbeat.  Difficulty breathing. Summary  Hypothyroidism is when the thyroid gland does not make enough of certain hormones (it is underactive).  When the thyroid is underactive, it produces too little of the hormones thyroxine (T4) and triiodothyronine (T3).  The most common cause is Hashimoto's disease, a disease in which the body's disease-fighting system (immune system) attacks the thyroid gland. The condition can also be caused by viral infections, medicine, pregnancy, or past   radiation treatment to the head or neck.  Symptoms may include weight gain, dry skin, constipation, feeling as though you do not have energy, and not being able to tolerate cold.  This condition is treated with medicine to replace the thyroid hormones that your body does not make. This information  is not intended to replace advice given to you by your health care provider. Make sure you discuss any questions you have with your health care provider. Document Released: 06/22/2005 Document Revised: 06/02/2017 Document Reviewed: 06/02/2017 Elsevier Interactive Patient Education  2019 [employer] Chronic Obstructive Pulmonary Disease Chronic obstructive pulmonary disease (COPD) is a long-term (chronic) lung problem. When you have COPD, it is hard for air to get in and out of your lungs. Usually the condition gets worse over time, and your lungs will never return to normal. There are things you can do to keep yourself as healthy as possible.  Your doctor may treat your condition with: ? Medicines. ? Oxygen. ? Lung surgery.  Your doctor may also recommend: ? Rehabilitation. This includes steps to make your body work better. It may involve a team of specialists. ? Quitting smoking, if you smoke. ? Exercise and changes to your diet. ? Comfort measures (palliative care). Follow these instructions at home: Medicines  Take over-the-counter and prescription medicines only as told by your doctor.  Talk to your doctor before taking any cough or allergy medicines. You may need to avoid medicines that cause your lungs to be dry. Lifestyle  If you smoke, stop. Smoking makes the problem worse. If you need help quitting, ask your doctor.  Avoid being around things that make your breathing worse. This may include smoke, chemicals, and fumes.  Stay active, but remember to rest as well.  Learn and use tips on how to relax.  Make sure you get enough sleep. Most adults need at least 7 hours of sleep every night.  Eat healthy foods. Eat smaller meals more often. Rest before meals. Controlled breathing Learn and use tips on how to control your breathing as told by your doctor. Try:  Breathing in (inhaling) through your nose for 1 second. Then, pucker your lips and breath out (exhale) through  your lips for 2 seconds.  Putting one hand on your belly (abdomen). Breathe in slowly through your nose for 1 second. Your hand on your belly should move out. Pucker your lips and breathe out slowly through your lips. Your hand on your belly should move in as you breathe out.  Controlled coughing Learn and use controlled coughing to clear mucus from your lungs. Follow these steps: 1. Lean your head a little forward. 2. Breathe in deeply. 3. Try to hold your breath for 3 seconds. 4. Keep your mouth slightly open while coughing 2 times. 5. Spit any mucus out into a tissue. 6. Rest and do the steps again 1 or 2 times as needed. General instructions  Make sure you get all the shots (vaccines) that your doctor recommends. Ask your doctor about a flu shot and a pneumonia shot.  Use oxygen therapy and pulmonary rehabilitation if told by your doctor. If you need home oxygen therapy, ask your doctor if you should buy a tool to measure your oxygen level (oximeter).  Make a COPD action plan with your doctor. This helps you to know what to do if you feel worse than usual.  Manage any other conditions you have as told by your doctor.  Avoid going outside when it is very  hot, cold, or humid.  Avoid people who have a sickness you can catch (contagious).  Keep all follow-up visits as told by your doctor. This is important. Contact a doctor if:  You cough up more mucus than usual.  There is a change in the color or thickness of the mucus.  It is harder to breathe than usual.  Your breathing is faster than usual.  You have trouble sleeping.  You need to use your medicines more often than usual.  You have trouble doing your normal activities such as getting dressed or walking around the house. Get help right away if:  You have shortness of breath while resting.  You have shortness of breath that stops you from: ? Being able to talk. ? Doing normal activities.  Your chest hurts for  longer than 5 minutes.  Your skin color is more blue than usual.  Your pulse oximeter shows that you have low oxygen for longer than 5 minutes.  You have a fever.  You feel too tired to breathe normally. Summary  Chronic obstructive pulmonary disease (COPD) is a long-term lung problem.  The way your lungs work will never return to normal. Usually the condition gets worse over time. There are things you can do to keep yourself as healthy as possible.  Take over-the-counter and prescription medicines only as told by your doctor.  If you smoke, stop. Smoking makes the problem worse. This information is not intended to replace advice given to you by your health care provider. Make sure you discuss any questions you have with your health care provider. Document Released: 12/09/2007 Document Revised: 07/27/2016 Document Reviewed: 07/27/2016 Elsevier Interactive Patient Education  2019 [employer].

## 2018-08-31 NOTE — Progress Notes (Signed)
[facility]   Progress Note: General Provider: Mike Gip, FNP  SUBJECTIVE:   Javier Bridges is a 58 y.o. male who  has a past medical history of Bradycardia, Chronic back pain, Hepatitis C, chronic (HCC), Hypothyroidism, and Tobacco dependence.. Patient presents today for COPD; Hypothyroidism; and Arthritis Patient states that he feels that his thyroid medication is too strong. He states that he feels "run down and tired" and has a lack of appetite and contributes this to the medication. Denies jitters.  Patient states that he smokes 1/2 ppd. Not interested in quitting. History of arthritis. He states that he has a hx of RA and was seen by pain management. He denies treatment with rheumatology.    Review of Systems  Constitutional: Positive for malaise/fatigue.  HENT: Negative.   Eyes: Negative.   Respiratory: Negative.   Cardiovascular: Negative.   Gastrointestinal: Negative.   Genitourinary: Negative.   Musculoskeletal: Positive for joint pain.  Skin: Negative.   Neurological: Negative.   Psychiatric/Behavioral: Negative.      OBJECTIVE: BP 123/71 (BP Location: Right Arm, Patient Position: Sitting, Cuff Size: Normal)   Pulse 67   Temp 98.2 F (36.8 C) (Oral)   Resp 14   Ht 5\' 9"  (1.753 m)   Wt 161 lb (73 kg)   SpO2 97%   BMI 23.78 kg/m   Wt Readings from Last 3 Encounters:  08/31/18 161 lb (73 kg)  02/28/18 153 lb (69.4 kg)  11/26/17 156 lb (70.8 kg)     Physical Exam Vitals signs and nursing note reviewed.  Constitutional:      General: He is not in acute distress.    Appearance: He is well-developed.  HENT:     Head: Normocephalic and atraumatic.  Eyes:     Conjunctiva/sclera: Conjunctivae normal.     Pupils: Pupils are equal, round, and reactive to light.  Neck:     Musculoskeletal: Normal range of motion.  Cardiovascular:     Rate and Rhythm: Normal rate and regular rhythm.     Heart sounds: Normal  heart sounds.  Pulmonary:     Effort: Pulmonary effort is normal. No respiratory distress.     Breath sounds: Normal breath sounds.  Abdominal:     General: Bowel sounds are normal. There is no distension.     Palpations: Abdomen is soft.  Musculoskeletal:     Lumbar back: He exhibits decreased range of motion and tenderness. He exhibits no swelling.     Right hand: He exhibits decreased range of motion and bony tenderness.     Left hand: He exhibits decreased range of motion and bony tenderness.  Skin:    General: Skin is warm and dry.  Neurological:     Mental Status: He is alert and oriented to person, place, and time.  Psychiatric:        Behavior: Behavior normal.        Thought Content: Thought content normal.     ASSESSMENT/PLAN: 1. Acquired hypothyroidism TSH 11.4 today. Will need to increase medication.  - Thyroid Panel With TSH  2. Pain in joint involving multiple sites - Rheumatoid Arthritis Diagnostic Panel, Comprehensive - ANA,IFA RA Diag Pnl w/rflx Tit/Patn - Rheumatoid Arthritis Profile   Return in about 6 months (around 03/01/2019) for Hypothyroidism.    The patient was given clear instructions to go to ER or return to medical center if symptoms do not improve, worsen or new problems develop. The patient  verbalized understanding and agreed with plan of care.   Ms. Doug Sou. Nathaneil Canary, FNP-BC [facility] [address] [place], [place] [address] [phone]

## 2018-09-01 MED ORDER — LEVOTHYROXINE SODIUM 125 MCG PO TABS: 125.0000 ug | ORAL_TABLET | Freq: Every day | ORAL | 3 refills | Status: DC

## 2018-09-02 ENCOUNTER — Telehealth: Payer: Self-pay

## 2018-09-02 LAB — THYROID PANEL WITH TSH
Free Thyroxine Index: 1.9 (ref 1.2–4.9)
T3 Uptake Ratio: 27 % (ref 24–39)
T4, Total: 7.2 ug/dL (ref 4.5–12.0)
TSH: 11.4 u[IU]/mL — ABNORMAL HIGH (ref 0.450–4.500)

## 2018-09-02 LAB — RHEUMATOID ARTHRITIS PROFILE
Cyclic Citrullin Peptide Ab: 10 units (ref 0–19)
Rheumatoid fact SerPl-aCnc: 16.3 IU/mL — ABNORMAL HIGH (ref 0.0–13.9)

## 2018-09-02 LAB — ANA,IFA RA DIAG PNL W/RFLX TIT/PATN
ANA Titer 1: NEGATIVE
Cyclic Citrullin Peptide Ab: 11 units (ref 0–19)
Rheumatoid fact SerPl-aCnc: 16.5 IU/mL — ABNORMAL HIGH (ref 0.0–13.9)

## 2018-09-02 NOTE — Telephone Encounter (Signed)
-----   Message from Mike Gip, FNP sent at 09/01/2018  5:17 PM EST ----- Please let patient his thyroid levels are elevated.  This could be causing the fatigue.  I will need to increase his dose of levothyroxine to 125 mcg/day.  He will need to return in 6 to 8 weeks for lab testing.

## 2018-09-02 NOTE — Telephone Encounter (Signed)
Called and spoke with patient, advised that thyroid elevels were elevated and could be causing the fatigue. Advised that we will need to increase his levothroxine to 164mcg/day and he needs to follow up in 6 to 8 weeks for re-check. This was scheduled.  Also advised that we are referring to rheumatology for further evaluation since RA factor was positive. Patient verbalized understanding. Thanks!

## 2018-10-13 ENCOUNTER — Other Ambulatory Visit: Payer: Medicaid Other

## 2018-10-13 ENCOUNTER — Other Ambulatory Visit: Payer: Self-pay

## 2018-10-13 DIAGNOSIS — E039 Hypothyroidism, unspecified: Secondary | ICD-10-CM

## 2018-10-14 ENCOUNTER — Other Ambulatory Visit: Payer: Medicaid Other

## 2018-10-14 LAB — THYROID PANEL WITH TSH
Free Thyroxine Index: 3.2 (ref 1.2–4.9)
T3 Uptake Ratio: 33 % (ref 24–39)
T4, Total: 9.7 ug/dL (ref 4.5–12.0)
TSH: 0.304 u[IU]/mL — ABNORMAL LOW (ref 0.450–4.500)

## 2018-11-10 NOTE — Telephone Encounter (Signed)
Message sent to provider 

## 2019-02-05 ENCOUNTER — Other Ambulatory Visit: Payer: Self-pay | Admitting: Family Medicine

## 2019-02-05 DIAGNOSIS — G8929 Other chronic pain: Secondary | ICD-10-CM

## 2019-03-01 ENCOUNTER — Other Ambulatory Visit: Payer: Self-pay

## 2019-03-01 ENCOUNTER — Ambulatory Visit (INDEPENDENT_AMBULATORY_CARE_PROVIDER_SITE_OTHER): Payer: Medicaid Other | Admitting: Family Medicine

## 2019-03-01 ENCOUNTER — Encounter: Payer: Self-pay | Admitting: Family Medicine

## 2019-03-01 VITALS — BP 98/71 | HR 63 | Temp 98.3°F | Resp 16 | Ht 69.0 in | Wt 161.0 lb

## 2019-03-01 DIAGNOSIS — J449 Chronic obstructive pulmonary disease, unspecified: Secondary | ICD-10-CM

## 2019-03-01 DIAGNOSIS — L219 Seborrheic dermatitis, unspecified: Secondary | ICD-10-CM | POA: Diagnosis not present

## 2019-03-01 DIAGNOSIS — E039 Hypothyroidism, unspecified: Secondary | ICD-10-CM

## 2019-03-01 MED ORDER — KETOCONAZOLE 2 % EX SHAM: 1.0000 "application " | MEDICATED_SHAMPOO | CUTANEOUS | 0 refills | Status: DC

## 2019-03-01 MED ORDER — CLOTRIMAZOLE 1 % EX CREA: 1.0000 "application " | TOPICAL_CREAM | Freq: Two times a day (BID) | CUTANEOUS | 0 refills | Status: DC

## 2019-03-01 NOTE — Patient Instructions (Signed)
Seborrheic Dermatitis, Adult Seborrheic dermatitis is a skin disease that causes red, scaly patches. It usually occurs on the scalp, and it is often called dandruff. The patches may appear on other parts of the body. Skin patches tend to appear where there are many oil glands in the skin. Areas of the body that are commonly affected include:  Scalp.  Skin folds of the body.  Ears.  Eyebrows.  Neck.  Face.  Armpits.  The bearded area of men's faces. The condition may come and go for no known reason, and it is often long-lasting (chronic). What are the causes? The cause of this condition is not known. What increases the risk? This condition is more likely to develop in people who:  Have certain conditions, such as: ? HIV (human immunodeficiency virus). ? AIDS (acquired immunodeficiency syndrome). ? Parkinson disease. ? Mood disorders, such as depression.  Are 40-60 years old. What are the signs or symptoms? Symptoms of this condition include:  Thick scales on the scalp.  Redness on the face or in the armpits.  Skin that is flaky. The flakes may be white or yellow.  Skin that seems oily or dry but is not helped with moisturizers.  Itching or burning in the affected areas. How is this diagnosed? This condition is diagnosed with a medical history and physical exam. A sample of your skin may be tested (skin biopsy). You may need to see a skin specialist (dermatologist). How is this treated? There is no cure for this condition, but treatment can help to manage the symptoms. You may get treatment to remove scales, lower the risk of skin infection, and reduce swelling or itching. Treatment may include:  Creams that reduce swelling and irritation (steroids).  Creams that reduce skin yeast.  Medicated shampoo, soaps, moisturizing creams, or ointments.  Medicated moisturizing creams or ointments. Follow these instructions at home:  Apply over-the-counter and prescription  medicines only as told by your health care provider.  Use any medicated shampoo, soaps, skin creams, or ointments only as told by your health care provider.  Keep all follow-up visits as told by your health care provider. This is important. Contact a health care provider if:  Your symptoms do not improve with treatment.  Your symptoms get worse.  You have new symptoms. This information is not intended to replace advice given to you by your health care provider. Make sure you discuss any questions you have with your health care provider. Document Released: 06/22/2005 Document Revised: 06/04/2017 Document Reviewed: 10/10/2015 Elsevier Patient Education  2020 Elsevier Inc.  

## 2019-03-01 NOTE — Progress Notes (Signed)
[facility]   Progress Note: General Provider: Lanae Boast, FNP  SUBJECTIVE:   Javier Bridges is a 58 y.o. male who  has a past medical history of Bradycardia, Chronic back pain, Hepatitis C, chronic ([place]), Hypothyroidism, and Tobacco dependence.. Patient presents today for Hypothyroidism, COPD, Rash (on head ), and Pruritis (ears itching )  COPD This is a chronic problem. The current episode started more than 1 year ago. The problem has been unchanged. His past medical history is significant for COPD.  Rash This is a recurrent problem. The current episode started 1 to 4 weeks ago. The problem has been waxing and waning since onset. The affected locations include the scalp and head. The rash is characterized by dryness, itchiness, peeling and redness. It is unknown if there was an exposure to a precipitant. Past treatments include anti-itch cream. The treatment provided mild relief. His past medical history is significant for allergies. (Seborrheic derm)      Review of Systems  Skin: Positive for rash.  All other systems reviewed and are negative.    OBJECTIVE: BP 98/71 (BP Location: Left Arm, Patient Position: Sitting, Cuff Size: Normal)   Pulse 63   Temp 98.3 F (36.8 C) (Oral)   Resp 16   Ht 5\' 9"  (5.284 m)   Wt 161 lb (73 kg)   SpO2 98%   BMI 23.78 kg/m   Wt Readings from Last 3 Encounters:  03/01/19 161 lb (73 kg)  08/31/18 161 lb (73 kg)  02/28/18 153 lb (69.4 kg)     Physical Exam Vitals signs and nursing note reviewed.  Constitutional:      General: He is not in acute distress.    Appearance: Normal appearance.  HENT:     Head: Normocephalic and atraumatic.  Eyes:     Extraocular Movements: Extraocular movements intact.     Conjunctiva/sclera: Conjunctivae normal.     Pupils: Pupils are equal, round, and reactive to light.  Cardiovascular:     Rate and Rhythm: Normal rate and regular rhythm.     Heart  sounds: No murmur.  Pulmonary:     Effort: Pulmonary effort is normal.     Breath sounds: Normal breath sounds.  Musculoskeletal: Normal range of motion.  Skin:    General: Skin is warm and dry.     Findings: Rash (to the forehead and bilateral ears. R>L) present. Rash is papular and scaling.       Neurological:     Mental Status: He is alert and oriented to person, place, and time.  Psychiatric:        Mood and Affect: Mood normal.        Behavior: Behavior normal.        Thought Content: Thought content normal.        Judgment: Judgment normal.     ASSESSMENT/PLAN:   1. Acquired hypothyroidism - Thyroid Panel With TSH  2. Seborrheic dermatitis of scalp Discussed washing his hats on a regular basis as this is a recurring issue.  - ketoconazole (NIZORAL) 2 % shampoo; Apply 1 application topically 2 (two) times a week.  Dispense: 120 mL; Refill: 0 - clotrimazole (LOTRIMIN) 1 % cream; Apply 1 application topically 2 (two) times daily.  Dispense: 30 g; Refill: 0  3. Chronic obstructive pulmonary disease, unspecified COPD type ([place]) No medication changes warranted at the present time.     Return in about 6 months (around 09/01/2019) for Hypothyroidism.  The patient was given clear instructions to go to ER or return to medical center if symptoms do not improve, worsen or new problems develop. The patient verbalized understanding and agreed with plan of care.   Ms. Doug Sou. Nathaneil Canary, FNP-BC [facility] [address] [place], [place] [address] [phone]

## 2019-03-02 ENCOUNTER — Telehealth: Payer: Self-pay

## 2019-03-02 LAB — THYROID PANEL WITH TSH
Free Thyroxine Index: 2.9 (ref 1.2–4.9)
T3 Uptake Ratio: 31 % (ref 24–39)
T4, Total: 9.4 ug/dL (ref 4.5–12.0)
TSH: 1.78 u[IU]/mL (ref 0.450–4.500)

## 2019-03-02 NOTE — Telephone Encounter (Signed)
-----   Message from Lanae Boast, [place] sent at 03/02/2019  8:13 AM EDT ----- Your labs are stable. Continue with your current medications. Please remember to keep your follow up appointment. If you have problems, questions or concerns, please make an appointment to discuss. Thanks!

## 2019-03-02 NOTE — Telephone Encounter (Signed)
Called and spoke with patient, advised that all labs are stable and to keep next scheduled appointment. Thanks!

## 2019-03-02 NOTE — Progress Notes (Signed)
Your labs are stable. Continue with your current medications. Please remember to keep your follow up appointment. If you have problems, questions or concerns, please make an appointment to discuss. Thanks!

## 2019-03-15 ENCOUNTER — Encounter (HOSPITAL_COMMUNITY): Payer: Self-pay

## 2019-03-15 ENCOUNTER — Encounter (HOSPITAL_COMMUNITY): Payer: Self-pay | Admitting: *Deleted

## 2019-04-07 DIAGNOSIS — H1013 Acute atopic conjunctivitis, bilateral: Secondary | ICD-10-CM | POA: Diagnosis not present

## 2019-05-29 ENCOUNTER — Other Ambulatory Visit: Payer: Self-pay | Admitting: Family Medicine

## 2019-05-29 DIAGNOSIS — J449 Chronic obstructive pulmonary disease, unspecified: Secondary | ICD-10-CM

## 2019-06-21 DIAGNOSIS — H1013 Acute atopic conjunctivitis, bilateral: Secondary | ICD-10-CM | POA: Diagnosis not present

## 2019-08-31 ENCOUNTER — Telehealth: Payer: Self-pay | Admitting: Family Medicine

## 2019-08-31 NOTE — Telephone Encounter (Signed)
Pt was called no message left

## 2019-09-01 ENCOUNTER — Ambulatory Visit (INDEPENDENT_AMBULATORY_CARE_PROVIDER_SITE_OTHER): Payer: Medicaid Other | Admitting: Nurse Practitioner

## 2019-09-01 ENCOUNTER — Other Ambulatory Visit: Payer: Self-pay

## 2019-09-01 ENCOUNTER — Encounter: Payer: Self-pay | Admitting: Nurse Practitioner

## 2019-09-01 VITALS — BP 132/82 | HR 65 | Temp 98.5°F | Resp 16 | Ht 69.0 in | Wt 163.0 lb

## 2019-09-01 DIAGNOSIS — Z0000 Encounter for general adult medical examination without abnormal findings: Secondary | ICD-10-CM | POA: Diagnosis not present

## 2019-09-01 DIAGNOSIS — R454 Irritability and anger: Secondary | ICD-10-CM | POA: Diagnosis not present

## 2019-09-01 DIAGNOSIS — E039 Hypothyroidism, unspecified: Secondary | ICD-10-CM | POA: Diagnosis not present

## 2019-09-01 DIAGNOSIS — J449 Chronic obstructive pulmonary disease, unspecified: Secondary | ICD-10-CM

## 2019-09-01 DIAGNOSIS — F419 Anxiety disorder, unspecified: Secondary | ICD-10-CM

## 2019-09-01 DIAGNOSIS — Z716 Tobacco abuse counseling: Secondary | ICD-10-CM

## 2019-09-01 DIAGNOSIS — E781 Pure hyperglyceridemia: Secondary | ICD-10-CM | POA: Diagnosis not present

## 2019-09-01 NOTE — Patient Instructions (Addendum)
Mixed Bipolar Disorder Mixed bipolar disorder is a mental health disorder in which a person has episodes of emotional highs (mania), lows (depression), or both of these feelings at the same time. People with this disorder have very big mood changes (mood swings) that happen quickly on a regular basis. These episodes may be severe enough to cause problems with relationships, school, or work. In some cases, they can cause the person to be unsafe, and the person may need to be hospitalized. What are the causes? The cause of this condition is not known. What increases the risk? The following factors may make you more likely to develop this condition:  Having a family history of the disorder.  Abusing substances such as alcohol or drugs.  Having an anxiety disorder.  Having another illness, such as heart disease or thyroid disease. What are the signs or symptoms? Symptoms of this condition include having episodes of mania, depression, and sometimes symptoms of both at the same time. For instance, you may feel sad and full of energy at the same time. You may have mood swings almost every day. Symptoms of mania may include:  Very high self-esteem or self-confidence.  Being unusually talkative, or feeling a need to keep talking. Speech may be very fast. It may seem like you cannot stop talking.  Racing thoughts or constant talking, with quick shifts between topics that may or may not be related (flight of ideas).  Decreased ability to focus or concentrate.  Increased purposeful activity, such as work, study, or social activity.  Increased nonproductive activity. This could be pacing, squirming and fidgeting, or finger and toe tapping.  Impulsive behavior and poor judgment. This may result in high-risk activities, such as having unprotected sex or spending a lot of money. Symptoms of depression may include:  Feeling sad, hopeless, or helpless.  Lack of feeling or caring about  anything.  Not being able to enjoy things that you used to enjoy.  Trouble concentrating or remembering.  Trouble making decisions.  Thoughts of death, or desire to harm yourself. How is this diagnosed? This condition may be diagnosed based on:  Your symptoms and medical history. Your health care provider will ask about your emotional episodes.  A physical exam. This is done to rule out any health problems that may be causing symptoms. Your health care provider will also ask about your alcohol and drug use. How is this treated? Bipolar disorder is a long-term (chronic) illness. It is best controlled with treatment that is given on an ongoing basis, rather than only when symptoms are present. Treatment may include:  Medicine. Medicine can be prescribed by a provider who specializes in treating mental disorders (psychiatrist). ? Medicines called mood stabilizers, antipsychotics, or antidepressants may be prescribed. ? If symptoms occur even while one type of medicine is taken, other medicines may be added.  Psychotherapy. Some forms of talk therapy, such as cognitive behavioral therapy (CBT), can provide support, education, and guidance.  Coping methods, such as journaling or relaxation exercises. These may include: ? Yoga. ? Meditation. ? Deep breathing.  Lifestyle changes, such as: ? Limiting alcohol and drug use. ? Exercising regularly. ? Getting plenty of sleep. ? Making healthy eating choices. A combination of medicine, talk therapy, and coping methods is best. In severe cases, if other treatments do not work, a procedure may be used to change the brain chemicals that send messages between brain cells (neurotransmitters). This procedure, called electroconvulsive therapy (ECT), applies short electrical pulses to the   brain through the scalp. Follow these instructions at home: Activity   Return to your normal activities as told by your health care provider.  Find activities  that you enjoy, and make time to do them.  Exercise regularly as told by your health care provider. Lifestyle  Follow a set schedule for eating and sleeping.  Eat a balanced diet that includes fresh fruits and vegetables, whole grains, low-fat dairy products, and lean meats.  Get 7-8 or more hours of sleep each night.  Do not drink alcohol or use illegal drugs. General instructions  Take over-the-counter and prescription medicines only as told by your health care provider.  Think about joining a support group. Your health care provider may be able to recommend a group for you.  Talk with your family and loved ones about your treatment goals and about how they can help.  Keep all follow-up visits as told by your health care provider. This is important. Contact a health care provider if:  Your symptoms get worse.  You have side effects from your medicine.  You have trouble sleeping.  You have trouble doing daily activities.  You feel unsafe in your surroundings.  You are dealing with substance abuse. Get help right away if:  You think about hurting yourself or you try to hurt yourself.  You think about suicide. If you ever feel like you may hurt yourself or others, or have thoughts about taking your own life, get help right away. You can go to your nearest emergency department or call:  Your local emergency services (911 in the [place]  A suicide crisis helpline, such as the National Suicide Prevention Lifeline at (803)320-0048. This is open 24 hours a day. Summary  Mixed bipolar disorder is a mental health disorder in which a person has episodes of emotional highs (mania), lows (depression), or both of these feelings at the same time.  Bipolar disorder is a long-term (chronic) illness. It is best controlled with treatment that is given on an ongoing basis, rather than only when symptoms are present.  A combination of medicine, talk therapy, and coping methods is best  for treating this condition. This information is not intended to replace advice given to you by your health care provider. Make sure you discuss any questions you have with your health care provider. Document Revised: 06/04/2017 Document Reviewed: 10/16/2016 Elsevier Patient Education  2020 [employer].   Hypothyroidism  Hypothyroidism is when the thyroid gland does not make enough of certain hormones (it is underactive). The thyroid gland is a small gland located in the lower front part of the neck, just in front of the windpipe (trachea). This gland makes hormones that help control how the body uses food for energy (metabolism) as well as how the heart and brain function. These hormones also play a role in keeping your bones strong. When the thyroid is underactive, it produces too little of the hormones thyroxine (T4) and triiodothyronine (T3). What are the causes? This condition may be caused by:  Hashimoto's disease. This is a disease in which the body's disease-fighting system (immune system) attacks the thyroid gland. This is the most common cause.  Viral infections.  Pregnancy.  Certain medicines.  Birth defects.  Past radiation treatments to the head or neck for cancer.  Past treatment with radioactive iodine.  Past exposure to radiation in the environment.  Past surgical removal of part or all of the thyroid.  Problems with a gland in the center of the brain (  pituitary gland).  Lack of enough iodine in the diet. What increases the risk? You are more likely to develop this condition if:  You are male.  You have a family history of thyroid conditions.  You use a medicine called lithium.  You take medicines that affect the immune system (immunosuppressants). What are the signs or symptoms? Symptoms of this condition include:  Feeling as though you have no energy (lethargy).  Not being able to tolerate cold.  Weight gain that is not explained by a change in  diet or exercise habits.  Lack of appetite.  Dry skin.  Coarse hair.  Menstrual irregularity.  Slowing of thought processes.  Constipation.  Sadness or depression. How is this diagnosed? This condition may be diagnosed based on:  Your symptoms, your medical history, and a physical exam.  Blood tests. You may also have imaging tests, such as an ultrasound or MRI. How is this treated? This condition is treated with medicine that replaces the thyroid hormones that your body does not make. After you begin treatment, it may take several weeks for symptoms to go away. Follow these instructions at home:  Take over-the-counter and prescription medicines only as told by your health care provider.  If you start taking any new medicines, tell your health care provider.  Keep all follow-up visits as told by your health care provider. This is important. ? As your condition improves, your dosage of thyroid hormone medicine may change. ? You will need to have blood tests regularly so that your health care provider can monitor your condition. Contact a health care provider if:  Your symptoms do not get better with treatment.  You are taking thyroid replacement medicine and you: ? Sweat a lot. ? Have tremors. ? Feel anxious. ? Lose weight rapidly. ? Cannot tolerate heat. ? Have emotional swings. ? Have diarrhea. ? Feel weak. Get help right away if you have:  Chest pain.  An irregular heartbeat.  A rapid heartbeat.  Difficulty breathing. Summary  Hypothyroidism is when the thyroid gland does not make enough of certain hormones (it is underactive).  When the thyroid is underactive, it produces too little of the hormones thyroxine (T4) and triiodothyronine (T3).  The most common cause is Hashimoto's disease, a disease in which the body's disease-fighting system (immune system) attacks the thyroid gland. The condition can also be caused by viral infections, medicine,  pregnancy, or past radiation treatment to the head or neck.  Symptoms may include weight gain, dry skin, constipation, feeling as though you do not have energy, and not being able to tolerate cold.  This condition is treated with medicine to replace the thyroid hormones that your body does not make. This information is not intended to replace advice given to you by your health care provider. Make sure you discuss any questions you have with your health care provider. Document Revised: 06/04/2017 Document Reviewed: 06/02/2017 Elsevier Patient Education  2020 [employer].

## 2019-09-01 NOTE — Progress Notes (Signed)
Established Patient Office Visit  Subjective:  Patient ID: Javier Bridges, male    DOB: 1961/01/30  Age: 59 y.o. MRN: [id number]  CC:  Chief Complaint  Patient presents with  . Follow-up    6 month follow up   . Hypothyroidism  . COPD  . Anxiety    pt states he had had problem feeling nervous latley     HPI Javier Bridges presents for 67-month follow-up.  He  has a past medical history of Bradycardia, Chronic back pain, Hepatitis C, chronic (HCC), Hypothyroidism, and Tobacco dependence.   Hypothyroidism: He admits that he has adjusted the dose of the levothyroxine.  He has been feeling tired rundown with no appetite. He is eating more home cooked meals now and this is better.  His girlfriend has moved in.  He feels like his weight is improving.  COPD: He is currently using proair along with the Symbicort.  He admits that this is effective.  He does continue to smoke he has had increased smoking because of anxiety. Denies headache, dizziness, visual changes, shortness of breath, dyspnea on exertion, chest pain, nausea, vomiting or any edema.   Mood:He admits that he has been getting "ill and snapping" since Dec.  He denies feeling down or depressed.  He admits that there is a family history anxiety and irritability.  He has been told by several family members that he needed Valium or Xanax.  He admits that he has taken this in the past and did not like the side effects.  He admits that he is used Klonopin from his brother and this was effective.  It calmed him down however did not make him feel uncomfortable.   Past Medical History:  Diagnosis Date  . Bradycardia   . Chronic back pain   . Hepatitis C, chronic (HCC)    tx with harvoni  . Hypothyroidism   . Tobacco dependence     Past Surgical History:  Procedure Laterality Date  . ANTERIOR LATERAL LUMBAR FUSION 4 LEVELS N/A 09/13/2017   Procedure: Lumbar one-two Lumbar two-three Lumbar three-four Lumbar four-five Anterolateral  decompression/fusion with [name];  Surgeon: [name], MD;  Location: [facility] OR;  Service: Neurosurgery;  Laterality: N/A;  . APPLICATION OF ROBOTIC ASSISTANCE FOR SPINAL PROCEDURE N/A 09/13/2017   Procedure: lumbar one to lumbar five percutaneous screws APPLICATION OF ROBOTIC ASSISTANCE FOR SPINAL PROCEDURE;  Surgeon: [name], MD;  Location: [facility] OR;  Service: Neurosurgery;  Laterality: N/A;  . Feet Surgery  1970  . MULTIPLE TOOTH EXTRACTIONS      Family History  Problem Relation Age of Onset  . Hypertension Mother   . Cancer Father     Social History   Socioeconomic History  . Marital status: Single    Spouse name: Not on file  . Number of children: Not on file  . Years of education: Not on file  . Highest education level: Not on file  Occupational History  . Not on file  Tobacco Use  . Smoking status: Current Every Day Smoker    Packs/day: 0.25    Types: Cigarettes  . Smokeless tobacco: Never Used  Substance and Sexual Activity  . Alcohol use: Yes    Alcohol/week: 14.0 standard drinks    Types: 14 Cans of beer per week    Comment: socially  . Drug use: Yes    Types: Marijuana    Comment: last time was 2/28  . Sexual activity: Never  Other Topics Concern  .  Not on file  Social History Narrative  . Not on file   Social Determinants of Health   Financial Resource Strain:   . Difficulty of Paying Living Expenses: Not on file  Food Insecurity:   . Worried About Programme researcher, broadcasting/film/video in the Last Year: Not on file  . Ran Out of Food in the Last Year: Not on file  Transportation Needs:   . Lack of Transportation (Medical): Not on file  . Lack of Transportation (Non-Medical): Not on file  Physical Activity:   . Days of Exercise per Week: Not on file  . Minutes of Exercise per Session: Not on file  Stress:   . Feeling of Stress : Not on file  Social Connections:   . Frequency of Communication with Friends and Family: Not on file  . Frequency of Social Gatherings  with Friends and Family: Not on file  . Attends Religious Services: Not on file  . Active Member of Clubs or Organizations: Not on file  . Attends Banker Meetings: Not on file  . Marital Status: Not on file  Intimate Partner Violence:   . Fear of Current or Ex-Partner: Not on file  . Emotionally Abused: Not on file  . Physically Abused: Not on file  . Sexually Abused: Not on file    Outpatient Medications Prior to Visit  Medication Sig Dispense Refill  . aspirin 500 MG tablet Take 500 mg by mouth daily.    [name] gabapentin (NEURONTIN) 400 MG capsule TAKE 1 CAPSULE(400 MG) BY MOUTH THREE TIMES DAILY 90 capsule 5  . levothyroxine (SYNTHROID, LEVOTHROID) 125 MCG tablet Take 1 tablet (125 mcg total) by mouth daily. 90 tablet 3  . PROAIR HFA 108 (90 Base) MCG/ACT inhaler INHALE 2 PUFFS INTO THE LUNGS EVERY 6 HOURS AS NEEDED FOR WHEEZING OR SHORTNESS OF BREATH 8.5 g 3  . SYMBICORT 80-4.5 MCG/ACT inhaler INHALE 2 PUFFS INTO THE LUNGS TWICE DAILY 10.2 g 3  . clotrimazole (LOTRIMIN) 1 % cream Apply 1 application topically 2 (two) times daily. (Patient not taking: Reported on 09/01/2019) 30 g 0  . ketoconazole (NIZORAL) 2 % shampoo Apply 1 application topically 2 (two) times a week. 120 mL 0  . methocarbamol (ROBAXIN) 500 MG tablet Take 1 tablet (500 mg total) by mouth every 6 (six) hours as needed for muscle spasms. (Patient not taking: Reported on 08/31/2018) 40 tablet 1   No facility-administered medications prior to visit.    Allergies  Allergen Reactions  . Tricor [Fenofibrate]     Chest pain    ROS Review of Systems  Constitutional: Positive for unexpected weight change.  HENT: Negative.   Eyes: Negative.   Respiratory: Negative.   Cardiovascular: Negative.   Gastrointestinal: Negative.   Endocrine: Negative.   Genitourinary: Negative.   Musculoskeletal: Negative.   Skin: Negative.   Allergic/Immunologic: Negative.   Neurological: Negative.   Hematological: Negative.        Objective:    Physical Exam  Constitutional: He is oriented to person, place, and time. He appears well-developed and well-nourished.  Cardiovascular: Normal rate, regular rhythm and normal heart sounds.  Pulmonary/Chest: Effort normal and breath sounds normal.  Abdominal: Soft. Bowel sounds are normal.  Musculoskeletal:        General: Normal range of motion.     Cervical back: Normal range of motion.  Neurological: He is alert and oriented to person, place, and time.  Skin: Skin is warm and dry.  Multiple old tattoos  Psychiatric: He has a normal mood and affect. His behavior is normal. Judgment and thought content normal.    BP 132/82 (BP Location: Right Arm, Patient Position: Sitting, Cuff Size: Normal)   Pulse 65   Temp 98.5 F (36.9 C) (Oral)   Resp 16   Ht 5\' 9"  (1.753 m)   Wt 163 lb (73.9 kg)   SpO2 100%   BMI 24.07 kg/m  Wt Readings from Last 3 Encounters:  09/01/19 163 lb (73.9 kg)  03/01/19 161 lb (73 kg)  08/31/18 161 lb (73 kg)     There are no preventive care reminders to display for this patient.  There are no preventive care reminders to display for this patient.  Lab Results  Component Value Date   TSH 1.780 03/01/2019   Lab Results  Component Value Date   WBC 9.0 02/28/2018   HGB 13.5 02/28/2018   HCT 41.2 02/28/2018   MCV 88 02/28/2018   PLT 321 02/28/2018   Lab Results  Component Value Date   NA 138 02/28/2018   K 4.3 02/28/2018   CO2 23 02/28/2018   GLUCOSE 95 02/28/2018   BUN 15 02/28/2018   CREATININE 1.02 02/28/2018   BILITOT 0.2 02/28/2018   ALKPHOS 119 (H) 02/28/2018   AST 15 02/28/2018   ALT 5 02/28/2018   PROT 7.2 02/28/2018   ALBUMIN 4.0 02/28/2018   CALCIUM 9.5 02/28/2018   ANIONGAP 8 09/14/2017   Lab Results  Component Value Date   CHOL 388 (H) 09/30/2016   Lab Results  Component Value Date   HDL 44 09/30/2016   Lab Results  Component Value Date   LDLCALC 310 (H) 09/30/2016   Lab Results  Component  Value Date   TRIG 168 (H) 09/30/2016   Lab Results  Component Value Date   CHOLHDL 8.8 (H) 09/30/2016   Lab Results  Component Value Date   HGBA1C 5.9 09/30/2016      Assessment & Plan:   Problem List Items Addressed This Visit      High   Acquired hypothyroidism   Relevant Orders   Comp. Metabolic Panel (12)   TSH   T3, Free   T4, Free   COPD (chronic obstructive pulmonary disease) (HCC)   Hypertriglyceridemia   Relevant Orders   Lipid panel    Other Visit Diagnoses    Tobacco abuse counseling    -  Primary   Anxiety       Referral to psychiatry   Relevant Orders   Ambulatory referral to Psychiatry   Irritability       Referral to psychiatry   Relevant Orders   Ambulatory referral to Psychiatry   Healthcare maintenance       Relevant Orders   CBC with Differential/Platelet   VITAMIN D 25 Hydroxy (Vit-D Deficiency, Fractures)   Vitamin B12   PSA      No orders of the defined types were placed in this encounter.   Follow-up: Return in about 3 months (around 11/29/2019).    Vevelyn Francois, NP

## 2019-09-02 LAB — LIPID PANEL
Chol/HDL Ratio: 8.6 ratio — ABNORMAL HIGH (ref 0.0–5.0)
Cholesterol, Total: 447 mg/dL — ABNORMAL HIGH (ref 100–199)
HDL: 52 mg/dL (ref >39)
LDL Chol Calc (NIH): 352 mg/dL — ABNORMAL HIGH (ref 0–99)
Triglycerides: 195 mg/dL — ABNORMAL HIGH (ref 0–149)
VLDL Cholesterol Cal: 43 mg/dL — ABNORMAL HIGH (ref 5–40)

## 2019-09-02 LAB — COMP. METABOLIC PANEL (12)
AST: 106 IU/L — ABNORMAL HIGH (ref 0–40)
Albumin/Globulin Ratio: 1.3 (ref 1.2–2.2)
Albumin: 4.3 g/dL (ref 3.8–4.8)
Alkaline Phosphatase: 95 IU/L (ref 39–117)
BUN/Creatinine Ratio: 8 — ABNORMAL LOW (ref 10–24)
BUN: 11 mg/dL (ref 8–27)
Bilirubin Total: 0.3 mg/dL (ref 0.0–1.2)
Calcium: 9.6 mg/dL (ref 8.6–10.2)
Chloride: 98 mmol/L (ref 96–106)
Creatinine, Ser: 1.44 mg/dL — ABNORMAL HIGH (ref 0.76–1.27)
GFR calc Af Amer: 60 mL/min/{1.73_m2} (ref >59)
GFR calc non Af Amer: 52 mL/min/{1.73_m2} — ABNORMAL LOW (ref >59)
Globulin, Total: 3.3 g/dL (ref 1.5–4.5)
Glucose: 78 mg/dL (ref 65–99)
Potassium: 4 mmol/L (ref 3.5–5.2)
Sodium: 137 mmol/L (ref 134–144)
Total Protein: 7.6 g/dL (ref 6.0–8.5)

## 2019-09-02 LAB — CBC WITH DIFFERENTIAL/PLATELET
Basophils Absolute: 0.1 10*3/uL (ref 0.0–0.2)
Basos: 1 %
EOS (ABSOLUTE): 0.7 10*3/uL — ABNORMAL HIGH (ref 0.0–0.4)
Eos: 8 %
Hematocrit: 46 % (ref 37.5–51.0)
Hemoglobin: 16.1 g/dL (ref 13.0–17.7)
Immature Grans (Abs): 0 10*3/uL (ref 0.0–0.1)
Immature Granulocytes: 0 %
Lymphocytes Absolute: 3 10*3/uL (ref 0.7–3.1)
Lymphs: 37 %
MCH: 30.7 pg (ref 26.6–33.0)
MCHC: 35 g/dL (ref 31.5–35.7)
MCV: 88 fL (ref 79–97)
Monocytes Absolute: 0.5 10*3/uL (ref 0.1–0.9)
Monocytes: 7 %
Neutrophils Absolute: 4 10*3/uL (ref 1.4–7.0)
Neutrophils: 47 %
Platelets: 280 10*3/uL (ref 150–450)
RBC: 5.24 x10E6/uL (ref 4.14–5.80)
RDW: 15.3 % (ref 11.6–15.4)
WBC: 8.3 10*3/uL (ref 3.4–10.8)

## 2019-09-02 LAB — PSA: Prostate Specific Ag, Serum: 0.3 ng/mL (ref 0.0–4.0)

## 2019-09-02 LAB — VITAMIN D 25 HYDROXY (VIT D DEFICIENCY, FRACTURES): Vit D, 25-Hydroxy: 12.7 ng/mL — ABNORMAL LOW (ref 30.0–100.0)

## 2019-09-02 LAB — TSH: TSH: 230 u[IU]/mL — ABNORMAL HIGH (ref 0.450–4.500)

## 2019-09-05 ENCOUNTER — Other Ambulatory Visit: Payer: Self-pay

## 2019-09-05 DIAGNOSIS — J449 Chronic obstructive pulmonary disease, unspecified: Secondary | ICD-10-CM

## 2019-09-05 LAB — T3, FREE: T3, Free: <0.3 pg/mL — ABNORMAL LOW (ref 2.0–4.4)

## 2019-09-05 LAB — T4, FREE: Free T4: <0.1 ng/dL — ABNORMAL LOW (ref 0.82–1.77)

## 2019-09-05 LAB — SPECIMEN STATUS REPORT

## 2019-09-05 MED ORDER — ALBUTEROL SULFATE HFA 108 (90 BASE) MCG/ACT IN AERS: INHALATION_SPRAY | RESPIRATORY_TRACT | 3 refills | Status: DC

## 2019-09-06 ENCOUNTER — Other Ambulatory Visit: Payer: Self-pay | Admitting: Nurse Practitioner

## 2019-09-06 ENCOUNTER — Telehealth: Payer: Self-pay

## 2019-09-06 DIAGNOSIS — E559 Vitamin D deficiency, unspecified: Secondary | ICD-10-CM

## 2019-09-06 DIAGNOSIS — E039 Hypothyroidism, unspecified: Secondary | ICD-10-CM

## 2019-09-06 MED ORDER — ERGOCALCIFEROL 1.25 MG (50000 UT) PO CAPS: 50000.0000 [IU] | ORAL_CAPSULE | ORAL | 0 refills | Status: DC

## 2019-09-06 MED ORDER — LEVOTHYROXINE SODIUM 50 MCG PO TABS: 50.0000 ug | ORAL_TABLET | Freq: Every day | ORAL | 2 refills | Status: DC

## 2019-09-06 NOTE — Progress Notes (Signed)
Telephone visit  Subjective:    Patient ID: Javier Bridges, male    DOB: 11/04/1960, 59 y.o.   MRN: [id number]  Lab follow-up  HPI Patient was called to discuss recent lab work.  has a past medical history of Bradycardia, Chronic back pain, Hepatitis C, chronic ([place]), Hypothyroidism, and Tobacco dependence.  His TSH 5 days ago was 230.000 with free T4 less than 0.10 and free T3 less than 0.3.  He admitted at his office visit he had adjusted his dose of levothyroxine.  He felt the higher dose made him "feel bad".  " He had decreased appetite and decreased energy".  Today he is states, "he would be honest and he is not taking the levothyroxine in a few months".  Today he states he feels good".  He denies any headache, fatigue, cold intolerance,, chest pains, muscle cramps, constipation.  His only complaint is when he gets upset he has some anxiety. He admits that he did gain weight.  He did admit that he was in treatment for the chronic hepatitis C when he was first diagnosed with hypothyroidism.  He felt like the treatment was what was causing him to feel so bad.  At that time he had lost weight down to 130 pounds.  He admits now that he is at his normal weight of 163 pounds.  He is eating well and doing better.  Past Medical History:  Diagnosis Date  . Bradycardia   . Chronic back pain   . Hepatitis C, chronic ([place])    tx with harvoni  . Hypothyroidism   . Tobacco dependence     Past Surgical History:  Procedure Laterality Date  . ANTERIOR LATERAL LUMBAR FUSION 4 LEVELS N/A 09/13/2017   Procedure: Lumbar one-two Lumbar two-three Lumbar three-four Lumbar four-five Anterolateral decompression/fusion with [place] Spaniel;  Surgeon: Kristeen Miss, MD;  Location: [place];  Service: Neurosurgery;  Laterality: N/A;  . APPLICATION OF ROBOTIC ASSISTANCE FOR SPINAL PROCEDURE N/A 09/13/2017   Procedure: lumbar one to lumbar five percutaneous screws APPLICATION OF ROBOTIC ASSISTANCE FOR SPINAL PROCEDURE;   Surgeon: Kristeen Miss, MD;  Location: [place];  Service: Neurosurgery;  Laterality: N/A;  . Feet Surgery  1970  . MULTIPLE TOOTH EXTRACTIONS      Family History  Problem Relation Age of Onset  . Hypertension Mother   . Cancer Father     Social History   Socioeconomic History  . Marital status: Single    Spouse name: Not on file  . Number of children: Not on file  . Years of education: Not on file  . Highest education level: Not on file  Occupational History  . Not on file  Tobacco Use  . Smoking status: Current Every Day Smoker    Packs/day: 0.25    Types: Cigarettes  . Smokeless tobacco: Never Used  Substance and Sexual Activity  . Alcohol use: Yes    Alcohol/week: 14.0 standard drinks    Types: 14 Cans of beer per week    Comment: socially  . Drug use: Yes    Types: Marijuana    Comment: last time was 2/28  . Sexual activity: Never  Other Topics Concern  . Not on file  Social History Narrative  . Not on file   Social Determinants of Health   Financial Resource Strain:   . Difficulty of Paying Living Expenses: Not on file  Food Insecurity:   . Worried About Charity fundraiser in the Last Year: Not on  file  . Ran Out of Food in the Last Year: Not on file  Transportation Needs:   . Lack of Transportation (Medical): Not on file  . Lack of Transportation (Non-Medical): Not on file  Physical Activity:   . Days of Exercise per Week: Not on file  . Minutes of Exercise per Session: Not on file  Stress:   . Feeling of Stress : Not on file  Social Connections:   . Frequency of Communication with Friends and Family: Not on file  . Frequency of Social Gatherings with Friends and Family: Not on file  . Attends Religious Services: Not on file  . Active Member of Clubs or Organizations: Not on file  . Attends Banker Meetings: Not on file  . Marital Status: Not on file  Intimate Partner Violence:   . Fear of Current or Ex-Partner: Not on file  .  Emotionally Abused: Not on file  . Physically Abused: Not on file  . Sexually Abused: Not on file    Outpatient Medications Prior to Visit  Medication Sig Dispense Refill  . albuterol (PROAIR HFA) 108 (90 Base) MCG/ACT inhaler INHALE 2 PUFFS INTO THE LUNGS EVERY 6 HOURS AS NEEDED FOR WHEEZING OR SHORTNESS OF BREATH 8.5 g 3  . aspirin 500 MG tablet Take 500 mg by mouth daily.    [name] gabapentin (NEURONTIN) 400 MG capsule TAKE 1 CAPSULE(400 MG) BY MOUTH THREE TIMES DAILY 90 capsule 5  . SYMBICORT 80-4.5 MCG/ACT inhaler INHALE 2 PUFFS INTO THE LUNGS TWICE DAILY 10.2 g 3  . levothyroxine (SYNTHROID, LEVOTHROID) 125 MCG tablet Take 1 tablet (125 mcg total) by mouth daily. 90 tablet 3   No facility-administered medications prior to visit.    Allergies  Allergen Reactions  . Tricor [Fenofibrate]     Chest pain    Review of Systems     Objective:    Physical Exam  There were no vitals taken for this visit. Wt Readings from Last 3 Encounters:  09/01/19 73.9 kg  03/01/19 73 kg  08/31/18 73 kg    Health Maintenance Due  Topic Date Due  . COLONOSCOPY  08/18/2008    There are no preventive care reminders to display for this patient.   Lab Results  Component Value Date   TSH 230.000 (H) 09/01/2019   Lab Results  Component Value Date   WBC 8.3 09/01/2019   HGB 16.1 09/01/2019   HCT 46.0 09/01/2019   MCV 88 09/01/2019   PLT 280 09/01/2019   Lab Results  Component Value Date   NA 137 09/01/2019   K 4.0 09/01/2019   CO2 23 02/28/2018   GLUCOSE 78 09/01/2019   BUN 11 09/01/2019   CREATININE 1.44 (H) 09/01/2019   BILITOT 0.3 09/01/2019   ALKPHOS 95 09/01/2019   AST 106 (H) 09/01/2019   ALT 5 02/28/2018   PROT 7.6 09/01/2019   ALBUMIN 4.3 09/01/2019   CALCIUM 9.6 09/01/2019   ANIONGAP 8 09/14/2017   Lab Results  Component Value Date   CHOL 447 (H) 09/01/2019   Lab Results  Component Value Date   HDL 52 09/01/2019   Lab Results  Component Value Date   LDLCALC  352 (H) 09/01/2019   Lab Results  Component Value Date   TRIG 195 (H) 09/01/2019   Lab Results  Component Value Date   CHOLHDL 8.6 (H) 09/01/2019   Lab Results  Component Value Date   HGBA1C 5.9 09/30/2016  Assessment & Plan:   Problem List Items Addressed This Visit      High   Acquired hypothyroidism - Primary   Relevant Medications   levothyroxine (SYNTHROID) 50 MCG tablet     Unprioritized   Vitamin D deficiency       Meds ordered this encounter  Medications  . levothyroxine (SYNTHROID) 50 MCG tablet    Sig: Take 1 tablet (50 mcg total) by mouth daily before breakfast.    Dispense:  30 tablet    Refill:  2    Order Specific Question:   Supervising Provider    Answer:   Quentin Angst [id number]  . ergocalciferol (VITAMIN D2) 1.25 MG (50000 UT) capsule    Sig: Take 1 capsule (50,000 Units total) by mouth once a week. X 12 weeks.    Dispense:  12 capsule    Refill:  0    Do not add this medication to the electronic "Automatic Refill" notification system.    Order Specific Question:   Supervising Provider    Answer:   Quentin Angst [[id number]]     Barbette Merino, NP

## 2019-09-06 NOTE — Telephone Encounter (Signed)
Patient returned call. Advised of thyroid levels being abnormal. He says he was not taking medication at all. I have transferred phone call to provider to talk with him as well. Thanks !

## 2019-09-06 NOTE — Telephone Encounter (Signed)
Called and spoke with patients mother, pt was not available. She took our phone number and name and will have him call back. Thanks!

## 2019-09-06 NOTE — Telephone Encounter (Signed)
Called, no answer. No voicemail. Will try later.  

## 2019-09-06 NOTE — Telephone Encounter (Signed)
-----   Message from Barbette Merino, NP sent at 09/06/2019 12:04 PM EST ----- Vernona Rieger I attempted to call Mr. Locker.  However no voicemail.  I did need to ask him a few questions about his Synthroid.  He admits that he has been adjusting the dose because it was making him feel tired and rundown with no appetite.  He has had an increased appetite in the last few months.If you can find out exactly how he is taking the Synthroid so that the proper treatment can start.Thanks

## 2019-09-12 ENCOUNTER — Ambulatory Visit (HOSPITAL_COMMUNITY): Admission: RE | Admit: 2019-09-12 | Payer: Medicaid Other | Source: Ambulatory Visit

## 2019-10-09 ENCOUNTER — Other Ambulatory Visit: Payer: Self-pay | Admitting: Family Medicine

## 2019-10-09 DIAGNOSIS — G8929 Other chronic pain: Secondary | ICD-10-CM

## 2019-10-09 DIAGNOSIS — M5442 Lumbago with sciatica, left side: Secondary | ICD-10-CM

## 2019-10-10 ENCOUNTER — Other Ambulatory Visit: Payer: Self-pay

## 2019-10-10 DIAGNOSIS — J449 Chronic obstructive pulmonary disease, unspecified: Secondary | ICD-10-CM

## 2019-10-10 MED ORDER — BUDESONIDE-FORMOTEROL FUMARATE 80-4.5 MCG/ACT IN AERO: 2.0000 | INHALATION_SPRAY | Freq: Two times a day (BID) | RESPIRATORY_TRACT | 3 refills | Status: DC

## 2019-11-29 ENCOUNTER — Ambulatory Visit (INDEPENDENT_AMBULATORY_CARE_PROVIDER_SITE_OTHER): Payer: Medicaid Other | Admitting: Nurse Practitioner

## 2019-11-29 ENCOUNTER — Encounter: Payer: Self-pay | Admitting: Nurse Practitioner

## 2019-11-29 ENCOUNTER — Other Ambulatory Visit: Payer: Self-pay

## 2019-11-29 VITALS — BP 111/79 | HR 61 | Temp 97.8°F | Resp 18 | Wt 159.6 lb

## 2019-11-29 DIAGNOSIS — F4321 Adjustment disorder with depressed mood: Secondary | ICD-10-CM

## 2019-11-29 DIAGNOSIS — J449 Chronic obstructive pulmonary disease, unspecified: Secondary | ICD-10-CM | POA: Diagnosis not present

## 2019-11-29 DIAGNOSIS — Z9114 Patient's other noncompliance with medication regimen: Secondary | ICD-10-CM

## 2019-11-29 DIAGNOSIS — F17200 Nicotine dependence, unspecified, uncomplicated: Secondary | ICD-10-CM

## 2019-11-29 DIAGNOSIS — E039 Hypothyroidism, unspecified: Secondary | ICD-10-CM

## 2019-11-29 DIAGNOSIS — M5442 Lumbago with sciatica, left side: Secondary | ICD-10-CM

## 2019-11-29 DIAGNOSIS — G8929 Other chronic pain: Secondary | ICD-10-CM

## 2019-11-29 MED ORDER — ROSUVASTATIN CALCIUM 20 MG PO TABS
20.0000 mg | ORAL_TABLET | Freq: Every day | ORAL | 3 refills | Status: DC
Start: 2019-11-29 — End: 2020-07-15

## 2019-11-29 MED ORDER — GABAPENTIN 600 MG PO TABS
600.0000 mg | ORAL_TABLET | Freq: Three times a day (TID) | ORAL | 2 refills | Status: DC
Start: 2019-11-29 — End: 2020-03-14

## 2019-11-29 NOTE — Progress Notes (Signed)
Patient having issues with thyroid medication.  Per patient he lost about 20 lbs and appetite was decreased.  Patient has gained this weight back, but is not taking the medication as prescribed.

## 2019-11-29 NOTE — Patient Instructions (Signed)
High Cholesterol  High cholesterol is a condition in which the blood has high levels of a white, waxy, fat-like substance (cholesterol). The human body needs small amounts of cholesterol. The liver makes all the cholesterol that the body needs. Extra (excess) cholesterol comes from the food that we eat. Cholesterol is carried from the liver by the blood through the blood vessels. If you have high cholesterol, deposits (plaques) may build up on the walls of your blood vessels (arteries). Plaques make the arteries narrower and stiffer. Cholesterol plaques increase your risk for heart attack and stroke. Work with your health care provider to keep your cholesterol levels in a healthy range. What increases the risk? This condition is more likely to develop in people who:  Eat foods that are high in animal fat (saturated fat) or cholesterol.  Are overweight.  Are not getting enough exercise.  Have a family history of high cholesterol. What are the signs or symptoms? There are no symptoms of this condition. How is this diagnosed? This condition may be diagnosed from the results of a blood test.  If you are older than age 20, your health care provider may check your cholesterol every 4-6 years.  You may be checked more often if you already have high cholesterol or other risk factors for heart disease. The blood test for cholesterol measures:  "Bad" cholesterol (LDL cholesterol). This is the main type of cholesterol that causes heart disease. The desired level for LDL is less than 100.  "Good" cholesterol (HDL cholesterol). This type helps to protect against heart disease by cleaning the arteries and carrying the LDL away. The desired level for HDL is 60 or higher.  Triglycerides. These are fats that the body can store or burn for energy. The desired number for triglycerides is lower than 150.  Total cholesterol. This is a measure of the total amount of cholesterol in your blood, including LDL  cholesterol, HDL cholesterol, and triglycerides. A healthy number is less than 200. How is this treated? This condition is treated with diet changes, lifestyle changes, and medicines. Diet changes  This may include eating more whole grains, fruits, vegetables, nuts, and fish.  This may also include cutting back on red meat and foods that have a lot of added sugar. Lifestyle changes  Changes may include getting at least 40 minutes of aerobic exercise 3 times a week. Aerobic exercises include walking, biking, and swimming. Aerobic exercise along with a healthy diet can help you maintain a healthy weight.  Changes may also include quitting smoking. Medicines  Medicines are usually given if diet and lifestyle changes have failed to reduce your cholesterol to healthy levels.  Your health care provider may prescribe a statin medicine. Statin medicines have been shown to reduce cholesterol, which can reduce the risk of heart disease. Follow these instructions at home: Eating and drinking If told by your health care provider:  Eat chicken (without skin), fish, veal, shellfish, ground turkey breast, and round or loin cuts of red meat.  Do not eat fried foods or fatty meats, such as hot dogs and salami.  Eat plenty of fruits, such as apples.  Eat plenty of vegetables, such as broccoli, potatoes, and carrots.  Eat beans, peas, and lentils.  Eat grains such as barley, rice, couscous, and bulgur wheat.  Eat pasta without cream sauces.  Use skim or nonfat milk, and eat low-fat or nonfat yogurt and cheeses.  Do not eat or drink whole milk, cream, ice cream, egg yolks,   or hard cheeses.  Do not eat stick margarine or tub margarines that contain trans fats (also called partially hydrogenated oils).  Do not eat saturated tropical oils, such as coconut oil and palm oil.  Do not eat cakes, cookies, crackers, or other baked goods that contain trans fats.  General instructions  Exercise as  directed by your health care provider. Increase your activity level with activities such as gardening, walking, and taking the stairs.  Take over-the-counter and prescription medicines only as told by your health care provider.  Do not use any products that contain nicotine or tobacco, such as cigarettes and e-cigarettes. If you need help quitting, ask your health care provider.  Keep all follow-up visits as told by your health care provider. This is important. Contact a health care provider if:  You are struggling to maintain a healthy diet or weight.  You need help to start on an exercise program.  You need help to stop smoking. Get help right away if:  You have chest pain.  You have trouble breathing. This information is not intended to replace advice given to you by your health care provider. Make sure you discuss any questions you have with your health care provider. Document Revised: 06/25/2017 Document Reviewed: 12/21/2015 Elsevier Patient Education  2020 [employer]. Hypothyroidism  Hypothyroidism is when the thyroid gland does not make enough of certain hormones (it is underactive). The thyroid gland is a small gland located in the lower front part of the neck, just in front of the windpipe (trachea). This gland makes hormones that help control how the body uses food for energy (metabolism) as well as how the heart and brain function. These hormones also play a role in keeping your bones strong. When the thyroid is underactive, it produces too little of the hormones thyroxine (T4) and triiodothyronine (T3). What are the causes? This condition may be caused by:  Hashimoto's disease. This is a disease in which the body's disease-fighting system (immune system) attacks the thyroid gland. This is the most common cause.  Viral infections.  Pregnancy.  Certain medicines.  Birth defects.  Past radiation treatments to the head or neck for cancer.  Past treatment with  radioactive iodine.  Past exposure to radiation in the environment.  Past surgical removal of part or all of the thyroid.  Problems with a gland in the center of the brain (pituitary gland).  Lack of enough iodine in the diet. What increases the risk? You are more likely to develop this condition if:  You are male.  You have a family history of thyroid conditions.  You use a medicine called lithium.  You take medicines that affect the immune system (immunosuppressants). What are the signs or symptoms? Symptoms of this condition include:  Feeling as though you have no energy (lethargy).  Not being able to tolerate cold.  Weight gain that is not explained by a change in diet or exercise habits.  Lack of appetite.  Dry skin.  Coarse hair.  Menstrual irregularity.  Slowing of thought processes.  Constipation.  Sadness or depression. How is this diagnosed? This condition may be diagnosed based on:  Your symptoms, your medical history, and a physical exam.  Blood tests. You may also have imaging tests, such as an ultrasound or MRI. How is this treated? This condition is treated with medicine that replaces the thyroid hormones that your body does not make. After you begin treatment, it may take several weeks for symptoms to go away.  Follow these instructions at home:  Take over-the-counter and prescription medicines only as told by your health care provider.  If you start taking any new medicines, tell your health care provider.  Keep all follow-up visits as told by your health care provider. This is important. ? As your condition improves, your dosage of thyroid hormone medicine may change. ? You will need to have blood tests regularly so that your health care provider can monitor your condition. Contact a health care provider if:  Your symptoms do not get better with treatment.  You are taking thyroid replacement medicine and you: ? Sweat a lot. ? Have  tremors. ? Feel anxious. ? Lose weight rapidly. ? Cannot tolerate heat. ? Have emotional swings. ? Have diarrhea. ? Feel weak. Get help right away if you have:  Chest pain.  An irregular heartbeat.  A rapid heartbeat.  Difficulty breathing. Summary  Hypothyroidism is when the thyroid gland does not make enough of certain hormones (it is underactive).  When the thyroid is underactive, it produces too little of the hormones thyroxine (T4) and triiodothyronine (T3).  The most common cause is Hashimoto's disease, a disease in which the body's disease-fighting system (immune system) attacks the thyroid gland. The condition can also be caused by viral infections, medicine, pregnancy, or past radiation treatment to the head or neck.  Symptoms may include weight gain, dry skin, constipation, feeling as though you do not have energy, and not being able to tolerate cold.  This condition is treated with medicine to replace the thyroid hormones that your body does not make. This information is not intended to replace advice given to you by your health care provider. Make sure you discuss any questions you have with your health care provider. Document Revised: 06/04/2017 Document Reviewed: 06/02/2017 Elsevier Patient Education  2020 [employer] Complicated Grief Grief is a normal response to the death of someone close to you. Feelings of fear, anger, and guilt can affect almost everyone who loses a loved one. It is also common to have symptoms of depression while you are grieving. These include problems with sleep, loss of appetite, and lack of energy. They may last for weeks or months after a loss. Complicated grief is different from normal grief or depression. Normal grieving involves sadness and feelings of loss, but those feelings get better and heal over time. Complicated grief is a severe type of grief that lasts for a long time, usually for several months to a year or longer. It  interferes with your ability to function normally. Complicated grief may require treatment from a mental health care provider. What are the causes? The cause of this condition is not known. It is not clear why some people continue to struggle with grief and others do not. What increases the risk? You are more likely to develop this condition if:  The death of your loved one was sudden or unexpected.  The death of your loved one was due to a violent event.  Your loved one died from suicide.  Your loved one was a child or a young person.  You were very close to your loved one, or you were dependent on him or her.  You have a history of depression or anxiety. What are the signs or symptoms? Symptoms of this condition include:  Feeling disbelief or having a lack of emotion (numbness).  Being unable to enjoy good memories of your loved one.  Needing to avoid anything or anyone that reminds you  of your loved one.  Being unable to stop thinking about the death.  Feeling intense anger or guilt.  Feeling alone and hopeless.  Feeling that your life is meaningless and empty.  Losing the desire to move on with your life. How is this diagnosed? This condition may be diagnosed based on:  Your symptoms. Complicated grief will be diagnosed if you have ongoing symptoms of grief for 6-12 months or longer.  The effect of symptoms on your life. You may be diagnosed with this condition if your symptoms are interfering with your ability to live your life. Your health care provider may recommend that you see a mental health care provider. Many symptoms of depression are similar to the symptoms of complicated grief. It is important to be evaluated for complicated grief along with other mental health conditions. How is this treated? This condition is most commonly treated with talk therapy. This therapy is offered by a mental health specialist (psychiatrist). During therapy:  You will learn  healthy ways to cope with the loss of your loved one.  Your mental health care provider may recommend antidepressant medicines. Follow these instructions at home: Lifestyle   Take care of yourself. ? Eat on a regular basis, and maintain a healthy diet. Eat plenty of fruits, vegetables, lean protein, and whole grains. ? Try to get some exercise each day. Aim for 30 minutes of exercise on most days of the week. ? Keep a consistent sleep schedule. Try to get 8 or more hours of sleep each night. ? Start doing the things that you used to enjoy.  Do not use drugs or alcohol to ease your symptoms.  Spend time with friends and loved ones. General instructions  Take over-the-counter and prescription medicines only as told by your health care provider.  Consider joining a grief (bereavement) support group to help you deal with your loss.  Keep all follow-up visits as told by your health care provider. This is important. Contact a health care provider if:  Your symptoms prevent you from functioning normally.  Your symptoms do not get better with treatment. Get help right away if:  You have serious thoughts about hurting yourself or someone else.  You have suicidal feelings. If you ever feel like you may hurt yourself or others, or have thoughts about taking your own life, get help right away. You can go to your nearest emergency department or call:  Your local emergency services (911 in the [place]  A suicide crisis helpline, such as the National Suicide Prevention Lifeline at (941)574-5446. This is open 24 hours a day. Summary  Complicated grief is a severe type of grief that lasts for a long time. This grief is not likely to go away on its own. Get the help you need.  Some griefs are more difficult than others and can cause this condition. You may need a certain type of treatment to help you recover if the loss of your loved one was sudden, violent, or due to suicide.  You may feel  guilty about moving on with your life. Getting help does not mean that you are forgetting your loved one. It means that you are taking care of yourself.  Complicated grief is best treated with talk therapy. Medicines may also be prescribed.  Seek the help you need, and find support that will help you recover. This information is not intended to replace advice given to you by your health care provider. Make sure you discuss any questions you  have with your health care provider. Document Revised: 06/04/2017 Document Reviewed: 04/07/2017 Elsevier Patient Education  [place].

## 2019-11-29 NOTE — Progress Notes (Signed)
Established Patient Office Visit  Subjective:  Patient ID: Javier Bridges, male    DOB: Dec 04, 1960  Age: 59 y.o. MRN: [id number]  CC:  Chief Complaint  Patient presents with  . Follow-up    HPI Javier Bridges presents for follow up. He  has a past medical history of Bradycardia, Chronic back pain, Hepatitis C, chronic (HCC), Hypothyroidism, and Tobacco dependence.    Hypothyroidism MALEKE Bridges is a 59 y.o. male who presents for follow up of hypothyroidism. Current symptoms: weight changes His weight is down about 6 pounds.He is able to yard work.  Patient denies change in energy level, diarrhea, heat / cold intolerance, nervousness and palpitations. Symptoms have been basically asymptomatic.  He is still not completely compliant with his medication.  He was to start levothyroxine 50 mcg daily based on his last office visit.  He did not pick up this prescription and has continued to use the levothyroxine 112 mcg tablets..  He still complains that the higher dose made him feel bad.  He admits that his appetie was effected by the hepatitis C treatment. He admits he eats well at home and meals are homecooked.   He has a history of dyslipidemia.  He admits that he no longer uses the medication due to side effects.  The patient does not use medications that may worsen dyslipidemias (corticosteroids, progestins, anabolic steroids, diuretics, beta-blockers, amiodarone, cyclosporine, olanzapine). The patient exercises rarely. The patient is not known to have coexisting coronary artery disease.   Cardiac Risk Factors Age > 45-male, > 55-male:  YES  +1  Smoking:   YES  +1  Sig. family hx of CHD*:  YES  +1  Hypertension:   NO  Diabetes:   NO  HDL < 35:   NO  HDL > 59:   NO  Total: 3   *- Sig. family h/o CHD per NCEP = MI or sudden death at <55yo in  father or other 1st-degree male relative, or <65yo in mother or  other 1st-degree male relativeLab Review Lab Results  Component Value  Date   CHOL 447 (H) 09/01/2019   CHOL 388 (H) 09/30/2016   CHOL 379 (H) 06/04/2015   CHOL 364 (H) 01/17/2015   HDL 52 09/01/2019   HDL 44 09/30/2016   HDL 37 (L) 06/04/2015   HDL 40 01/17/2015      Past Medical History:  Diagnosis Date  . Bradycardia   . Chronic back pain   . Hepatitis C, chronic (HCC)    tx with harvoni  . Hypothyroidism   . Tobacco dependence     Past Surgical History:  Procedure Laterality Date  . ANTERIOR LATERAL LUMBAR FUSION 4 LEVELS N/A 09/13/2017   Procedure: Lumbar one-two Lumbar two-three Lumbar three-four Lumbar four-five Anterolateral decompression/fusion with [name];  Surgeon: [name], MD;  Location: [facility] OR;  Service: Neurosurgery;  Laterality: N/A;  . APPLICATION OF ROBOTIC ASSISTANCE FOR SPINAL PROCEDURE N/A 09/13/2017   Procedure: lumbar one to lumbar five percutaneous screws APPLICATION OF ROBOTIC ASSISTANCE FOR SPINAL PROCEDURE;  Surgeon: [name], MD;  Location: [facility] OR;  Service: Neurosurgery;  Laterality: N/A;  . Feet Surgery  1970  . MULTIPLE TOOTH EXTRACTIONS      Family History  Problem Relation Age of Onset  . Hypertension Mother   . Cancer Father     Social History   Socioeconomic History  . Marital status: Single    Spouse name: Not on file  . Number of children:  Not on file  . Years of education: Not on file  . Highest education level: Not on file  Occupational History  . Not on file  Tobacco Use  . Smoking status: Current Every Day Smoker    Packs/day: 0.25    Types: Cigarettes  . Smokeless tobacco: Never Used  Substance and Sexual Activity  . Alcohol use: Yes    Alcohol/week: 14.0 standard drinks    Types: 14 Cans of beer per week    Comment: socially  . Drug use: Yes    Types: Marijuana    Comment: last time was 2/28  . Sexual activity: Never  Other Topics Concern  . Not on file  Social History Narrative  . Not on file   Social Determinants of Health   Financial Resource Strain:   . Difficulty  of Paying Living Expenses:   Food Insecurity:   . Worried About Charity fundraiser in the Last Year:   . Arboriculturist in the Last Year:   Transportation Needs:   . Film/video editor (Medical):   [name] Lack of Transportation (Non-Medical):   Physical Activity:   . Days of Exercise per Week:   . Minutes of Exercise per Session:   Stress:   . Feeling of Stress :   Social Connections:   . Frequency of Communication with Friends and Family:   . Frequency of Social Gatherings with Friends and Family:   . Attends Religious Services:   . Active Member of Clubs or Organizations:   . Attends Archivist Meetings:   [name] Marital Status:   Intimate Partner Violence:   . Fear of Current or Ex-Partner:   . Emotionally Abused:   [name] Physically Abused:   . Sexually Abused:     Outpatient Medications Prior to Visit  Medication Sig Dispense Refill  . albuterol (PROAIR HFA) 108 (90 Base) MCG/ACT inhaler INHALE 2 PUFFS INTO THE LUNGS EVERY 6 HOURS AS NEEDED FOR WHEEZING OR SHORTNESS OF BREATH 8.5 g 3  . budesonide-formoterol (SYMBICORT) 80-4.5 MCG/ACT inhaler Inhale 2 puffs into the lungs 2 (two) times daily. 10.2 g 3  . gabapentin (NEURONTIN) 400 MG capsule TAKE 1 CAPSULE(400 MG) BY MOUTH THREE TIMES DAILY 90 capsule 5  . levothyroxine (SYNTHROID) 50 MCG tablet Take 1 tablet (50 mcg total) by mouth daily before breakfast. 30 tablet 2  . aspirin 500 MG tablet Take 500 mg by mouth daily.    . Vitamin D, Ergocalciferol, (DRISDOL) 1.25 MG (50000 UNIT) CAPS capsule TAKE 1 CAPSULE BY MOUTH 1 TIME A WEEK FOR 12 WEEKS (Patient not taking: Reported on 11/29/2019) 13 capsule 3   No facility-administered medications prior to visit.    Allergies  Allergen Reactions  . Tricor [Fenofibrate]     Chest pain    ROS Review of Systems  All other systems reviewed and are negative.     Objective:    Physical Exam  Constitutional: He is oriented to person, place, and time. He appears  well-developed. No distress.  HENT:  Head: Normocephalic and atraumatic.  Eyes: Pupils are equal, round, and reactive to light.  Cardiovascular: Normal rate, regular rhythm, normal heart sounds and intact distal pulses.  Pulmonary/Chest: Effort normal and breath sounds normal.  Abdominal: Soft. Bowel sounds are normal.  Musculoskeletal:        General: Normal range of motion.     Cervical back: Normal range of motion.  Neurological: He is alert and oriented to person, place,  and time.  Skin: Skin is warm and dry. He is not diaphoretic.  multiple Tattoos   Psychiatric: He has a normal mood and affect. His behavior is normal. Judgment and thought content normal.    BP 111/79 (BP Location: Right Arm, Patient Position: Sitting, Cuff Size: Normal)   Pulse 61   Temp 97.8 F (36.6 C) (Oral)   Resp 18   Wt 159 lb 9.6 oz (72.4 kg)   SpO2 100%   BMI 23.57 kg/m  Wt Readings from Last 3 Encounters:  11/29/19 159 lb 9.6 oz (72.4 kg)  09/01/19 163 lb (73.9 kg)  03/01/19 161 lb (73 kg)     Health Maintenance Due  Topic Date Due  . COVID-19 Vaccine (1) Never done  . COLONOSCOPY  Never done    There are no preventive care reminders to display for this patient.  Lab Results  Component Value Date   TSH 230.000 (H) 09/01/2019   Lab Results  Component Value Date   WBC 8.3 09/01/2019   HGB 16.1 09/01/2019   HCT 46.0 09/01/2019   MCV 88 09/01/2019   PLT 280 09/01/2019   Lab Results  Component Value Date   NA 137 09/01/2019   K 4.0 09/01/2019   CO2 23 02/28/2018   GLUCOSE 78 09/01/2019   BUN 11 09/01/2019   CREATININE 1.44 (H) 09/01/2019   BILITOT 0.3 09/01/2019   ALKPHOS 95 09/01/2019   AST 106 (H) 09/01/2019   ALT 5 02/28/2018   PROT 7.6 09/01/2019   ALBUMIN 4.3 09/01/2019   CALCIUM 9.6 09/01/2019   ANIONGAP 8 09/14/2017   Lab Results  Component Value Date   CHOL 447 (H) 09/01/2019   Lab Results  Component Value Date   HDL 52 09/01/2019   Lab Results   Component Value Date   LDLCALC 352 (H) 09/01/2019   Lab Results  Component Value Date   TRIG 195 (H) 09/01/2019   Lab Results  Component Value Date   CHOLHDL 8.6 (H) 09/01/2019   Lab Results  Component Value Date   HGBA1C 5.9 09/30/2016      Assessment & Plan:   Problem List Items Addressed This Visit      High   Acquired hypothyroidism   Relevant Orders   TSH   T4, free   T3   Comp. Metabolic Panel (12)   COPD (chronic obstructive pulmonary disease) (HCC)   Tobacco dependence     Medium   Back pain (Chronic)    Other Visit Diagnoses    Grief    -  Primary   Noncompliance with medications       Long discussion in reference to treatment regimens and his overall risk     Assessment:    Dyslipidemia as detailed above with 3 CHD risk factors using NCEP scheme above.  Target levels for LDL are: < 130 mg/dl (2 or more risk factors are present)  Explained to the patient the respective contributions of genetics, diet, and exercise to lipid levels and the use of medication in severe cases which do not respond to lifestyle alteration. The patient's interest and motivation in making lifestyle changes seems fair.    Plan:    The following changes are planned for the next 3 months, at which time the patient will return for repeat fasting lipids:  1. Dietary changes: Increase soluble fiber Reduce saturated fat, "trans" monounsaturated fatty acids, and cholesterol 2. Exercise changes:  Advised to engage in gardening and walking  frequency  goal is 3-4 times a week 3. Other treatment: Smoking cessation  4. Lipid-lowering medications: None at present.  New treatment started today; Crestor.  (Recommended by NCEP after 3-6 mos of dietary therapy & lifestyle modification,  except if CHD is present or LDL well above 190.) 5. Hormone replacement therapy (patient is a postmenopausal  woman): no 6. Screening for secondary causes of dyslipidemias: TSH Blood glucose Alkaline  phosphatase to r/o hepatobiliary obstruction Serum creatinine to r/o chronic renal failure Urinalysis to r/o nephrotic syndrome 7. Lipid screening for relatives:He is brother passed about 3 weeks ago. He dies due to massive heart attack. He admits that there has been other death.  He admits that he is handling it okay. He has 2 other brothers.    Note: The majority of the visit was spent in counseling on the pathophysiology and treatment of dyslipidemias. The total face-to-face time was in excess of 20 minutes.    Meds ordered this encounter  Medications  . rosuvastatin (CRESTOR) 20 MG tablet    Sig: Take 1 tablet (20 mg total) by mouth daily.    Dispense:  90 tablet    Refill:  3    Order Specific Question:   Supervising Provider    Answer:   Quentin Angst [id number]  . gabapentin (NEURONTIN) 600 MG tablet    Sig: Take 1 tablet (600 mg total) by mouth 3 (three) times daily.    Dispense:  90 tablet    Refill:  2    Order Specific Question:   Supervising Provider    Answer:   Quentin Angst [id number]    Follow-up: Return in about 3 months (around 02/29/2020).    Barbette Merino, NP

## 2019-11-30 LAB — COMP. METABOLIC PANEL (12)
AST: 40 IU/L (ref 0–40)
Albumin/Globulin Ratio: 1.3 (ref 1.2–2.2)
Albumin: 4.3 g/dL (ref 3.8–4.8)
Alkaline Phosphatase: 84 IU/L (ref 48–121)
BUN/Creatinine Ratio: 7 — ABNORMAL LOW (ref 10–24)
BUN: 11 mg/dL (ref 8–27)
Bilirubin Total: 0.4 mg/dL (ref 0.0–1.2)
Calcium: 9.9 mg/dL (ref 8.6–10.2)
Chloride: 100 mmol/L (ref 96–106)
Creatinine, Ser: 1.5 mg/dL — ABNORMAL HIGH (ref 0.76–1.27)
GFR calc Af Amer: 57 mL/min/{1.73_m2} — ABNORMAL LOW (ref >59)
GFR calc non Af Amer: 50 mL/min/{1.73_m2} — ABNORMAL LOW (ref >59)
Globulin, Total: 3.4 g/dL (ref 1.5–4.5)
Glucose: 88 mg/dL (ref 65–99)
Potassium: 4 mmol/L (ref 3.5–5.2)
Sodium: 138 mmol/L (ref 134–144)
Total Protein: 7.7 g/dL (ref 6.0–8.5)

## 2019-11-30 LAB — TSH: TSH: 174 u[IU]/mL — ABNORMAL HIGH (ref 0.450–4.500)

## 2019-11-30 LAB — T3: T3, Total: 29 ng/dL — ABNORMAL LOW (ref 71–180)

## 2019-11-30 LAB — T4, FREE: Free T4: 0.56 ng/dL — ABNORMAL LOW (ref 0.82–1.77)

## 2019-11-30 NOTE — Progress Notes (Signed)
Can you please inform the pt that his labs are improving TSH but he needs to take his medication everyday as we discussed. Also he needs to start the new cholesterol medication Crestor as discussed. Thanks

## 2020-01-04 ENCOUNTER — Other Ambulatory Visit: Payer: Self-pay | Admitting: Nurse Practitioner

## 2020-01-04 DIAGNOSIS — J449 Chronic obstructive pulmonary disease, unspecified: Secondary | ICD-10-CM

## 2020-01-31 ENCOUNTER — Other Ambulatory Visit: Payer: Self-pay | Admitting: Nurse Practitioner

## 2020-01-31 DIAGNOSIS — J449 Chronic obstructive pulmonary disease, unspecified: Secondary | ICD-10-CM

## 2020-02-29 ENCOUNTER — Other Ambulatory Visit: Payer: Self-pay

## 2020-02-29 ENCOUNTER — Ambulatory Visit (INDEPENDENT_AMBULATORY_CARE_PROVIDER_SITE_OTHER): Payer: Medicaid Other | Admitting: Nurse Practitioner

## 2020-02-29 ENCOUNTER — Encounter: Payer: Self-pay | Admitting: Nurse Practitioner

## 2020-02-29 VITALS — BP 102/81 | HR 63 | Temp 98.0°F | Resp 16 | Ht 67.0 in | Wt 162.0 lb

## 2020-02-29 DIAGNOSIS — E039 Hypothyroidism, unspecified: Secondary | ICD-10-CM | POA: Diagnosis not present

## 2020-02-29 DIAGNOSIS — E781 Pure hyperglyceridemia: Secondary | ICD-10-CM

## 2020-02-29 DIAGNOSIS — J449 Chronic obstructive pulmonary disease, unspecified: Secondary | ICD-10-CM

## 2020-02-29 NOTE — Progress Notes (Signed)
[facility] Patient [facility] [address] [place], [place]  [address] Phone:  [phone]   Fax:  [phone]   Established Patient Office Visit  Subjective:  Patient ID: Javier Bridges, male    DOB: 05/24/1961  Age: 59 y.o. MRN: [id number]  CC:  Chief Complaint  Patient presents with   Follow-up    Pt states he is concerned about his thyroid medication.    HPI Javier Bridges presents for follow up. He  has a past medical history of Bradycardia, Chronic back pain, Hepatitis C, chronic (HCC), Hypothyroidism, and Tobacco dependence.   Hypothyroidism Javier Bridges is a 59 y.o. male who presents for follow up of hypothyroidism. Current symptoms: weight changes . Patient denies change in energy level, diarrhea, heat / cold intolerance, nervousness and palpitations. Symptoms have stabilized. He is no longer taking his thyroid medication. He states that it makes him lose weight his last TSH was 174.000 down from 230.000. However 1 yr ago it was 1.780.  He is  follow up of elevated cholesterol. Compliance with treatment has been good. The patient exercises infrequently. Patient denies muscle pain associated with his medications.   Past Medical History:  Diagnosis Date   Bradycardia    Chronic back pain    Hepatitis C, chronic (HCC)    tx with harvoni   Hypothyroidism    Tobacco dependence     Past Surgical History:  Procedure Laterality Date   ANTERIOR LATERAL LUMBAR FUSION 4 LEVELS N/A 09/13/2017   Procedure: Lumbar one-two Lumbar two-three Lumbar three-four Lumbar four-five Anterolateral decompression/fusion with [name];  Surgeon: [name], MD;  Location: [facility] OR;  Service: Neurosurgery;  Laterality: N/A;   APPLICATION OF ROBOTIC ASSISTANCE FOR SPINAL PROCEDURE N/A 09/13/2017   Procedure: lumbar one to lumbar five percutaneous screws APPLICATION OF ROBOTIC ASSISTANCE FOR SPINAL PROCEDURE;  Surgeon: [name], MD;  Location: [facility] OR;  Service: Neurosurgery;   Laterality: N/A;   Feet Surgery  1970   MULTIPLE TOOTH EXTRACTIONS      Family History  Problem Relation Age of Onset   Hypertension Mother    Cancer Father     Social History   Socioeconomic History   Marital status: Single    Spouse name: Not on file   Number of children: Not on file   Years of education: Not on file   Highest education level: Not on file  Occupational History   Not on file  Tobacco Use   Smoking status: Current Every Day Smoker    Packs/day: 0.25    Types: Cigarettes   Smokeless tobacco: Never Used  Vaping Use   Vaping Use: Never used  Substance and Sexual Activity   Alcohol use: Yes    Alcohol/week: 14.0 standard drinks    Types: 14 Cans of beer per week    Comment: socially   Drug use: Yes    Types: Marijuana    Comment: last time was 2/28   Sexual activity: Never  Other Topics Concern   Not on file  Social History Narrative   Not on file   Social Determinants of Health   Financial Resource Strain:    Difficulty of Paying Living Expenses: Not on file  Food Insecurity:    Worried About Programme researcher, broadcasting/film/video in the Last Year: Not on file   [employer] of Food in the Last Year: Not on file  Transportation Needs:    Lack of Transportation (Medical): Not on file  Lack of Transportation (Non-Medical): Not on file  Physical Activity:    Days of Exercise per Week: Not on file   Minutes of Exercise per Session: Not on file  Stress:    Feeling of Stress : Not on file  Social Connections:    Frequency of Communication with Friends and Family: Not on file   Frequency of Social Gatherings with Friends and Family: Not on file   Attends Religious Services: Not on file   Active Member of Clubs or Organizations: Not on file   Attends Banker Meetings: Not on file   Marital Status: Not on file  Intimate Partner Violence:    Fear of Current or Ex-Partner: Not on file   Emotionally Abused: Not on file    Physically Abused: Not on file   Sexually Abused: Not on file    Outpatient Medications Prior to Visit  Medication Sig Dispense Refill   albuterol (VENTOLIN HFA) 108 (90 Base) MCG/ACT inhaler INHALE 2 PUFFS INTO THE LUNGS EVERY 6 HOURS AS NEEDED FOR WHEEZING OR SHORTNESS OF BREATH 8.5 g 3   rosuvastatin (CRESTOR) 20 MG tablet Take 1 tablet (20 mg total) by mouth daily. 90 tablet 3   SYMBICORT 80-4.5 MCG/ACT inhaler INHALE 2 PUFFS INTO THE LUNGS TWICE DAILY 10.2 g 3   gabapentin (NEURONTIN) 600 MG tablet Take 1 tablet (600 mg total) by mouth 3 (three) times daily. 90 tablet 2   levothyroxine (SYNTHROID) 50 MCG tablet Take 1 tablet (50 mcg total) by mouth daily before breakfast. 30 tablet 2   No facility-administered medications prior to visit.    Allergies  Allergen Reactions   Tricor [Fenofibrate]     Chest pain    ROS Review of Systems    Objective:    Physical Exam Constitutional:      General: He is not in acute distress.    Appearance: He is not ill-appearing, toxic-appearing or diaphoretic.  HENT:     Head: Normocephalic and atraumatic.     Nose: Nose normal.     Mouth/Throat:     Mouth: Mucous membranes are moist.  Cardiovascular:     Rate and Rhythm: Normal rate and regular rhythm.     Pulses: Normal pulses.     Heart sounds: Normal heart sounds.  Pulmonary:     Effort: Pulmonary effort is normal.     Comments: diminished  Abdominal:     Palpations: Abdomen is soft.  Musculoskeletal:        General: Normal range of motion.     Cervical back: Normal range of motion.  Skin:    General: Skin is warm and dry.     Capillary Refill: Capillary refill takes less than 2 seconds.  Neurological:     General: No focal deficit present.     Mental Status: He is alert and oriented to person, place, and time.  Psychiatric:        Mood and Affect: Mood normal.        Behavior: Behavior normal.        Thought Content: Thought content normal.        Judgment:  Judgment normal.     BP 102/81 (BP Location: Left Arm, Patient Position: Sitting, Cuff Size: Normal)    Pulse 63    Temp 98 F (36.7 C)    Resp 16    Ht 5\' 7"  (1.702 m)    Wt 162 lb (73.5 kg)    SpO2 99%  BMI 25.37 kg/m  Wt Readings from Last 3 Encounters:  02/29/20 162 lb (73.5 kg)  11/29/19 159 lb 9.6 oz (72.4 kg)  09/01/19 163 lb (73.9 kg)     Health Maintenance Due  Topic Date Due   INFLUENZA VACCINE  02/04/2020    There are no preventive care reminders to display for this patient.  Lab Results  Component Value Date   TSH 174.000 (H) 11/29/2019   Lab Results  Component Value Date   WBC 8.3 09/01/2019   HGB 16.1 09/01/2019   HCT 46.0 09/01/2019   MCV 88 09/01/2019   PLT 280 09/01/2019   Lab Results  Component Value Date   NA 138 11/29/2019   K 4.0 11/29/2019   CO2 23 02/28/2018   GLUCOSE 88 11/29/2019   BUN 11 11/29/2019   CREATININE 1.50 (H) 11/29/2019   BILITOT 0.4 11/29/2019   ALKPHOS 84 11/29/2019   AST 40 11/29/2019   ALT 5 02/28/2018   PROT 7.7 11/29/2019   ALBUMIN 4.3 11/29/2019   CALCIUM 9.9 11/29/2019   ANIONGAP 8 09/14/2017   Lab Results  Component Value Date   CHOL 447 (H) 09/01/2019   Lab Results  Component Value Date   HDL 52 09/01/2019   Lab Results  Component Value Date   LDLCALC 352 (H) 09/01/2019   Lab Results  Component Value Date   TRIG 195 (H) 09/01/2019   Lab Results  Component Value Date   CHOLHDL 8.6 (H) 09/01/2019   Lab Results  Component Value Date   HGBA1C 5.9 09/30/2016      Assessment & Plan:   Problem List Items Addressed This Visit      Respiratory   COPD (chronic obstructive pulmonary disease) (HCC) Encouraged smoking cessation  COV-19 precautions and proper mask wearing     Endocrine   Acquired hypothyroidism - Primary Encouraged complaince Education provided  Will refer patient to endo for further evaluation    Relevant Orders   TSH   T4, free   T3   Comp. Metabolic Panel (12)      Other   Hypertriglyceridemia  Continue dietary measures. Continue regular exercise.  Lipid-lowering medications: Rouvastatin 20 mg   Relevant Orders   Lipid panel      No orders of the defined types were placed in this encounter.   Follow-up: Return in about 3 months (around 05/31/2020).      Barbette Merino, NP

## 2020-02-29 NOTE — Patient Instructions (Signed)
Hypothyroidism  Hypothyroidism is when the thyroid gland does not make enough of certain hormones (it is underactive). The thyroid gland is a small gland located in the lower front part of the neck, just in front of the windpipe (trachea). This gland makes hormones that help control how the body uses food for energy (metabolism) as well as how the heart and brain function. These hormones also play a role in keeping your bones strong. When the thyroid is underactive, it produces too little of the hormones thyroxine (T4) and triiodothyronine (T3). What are the causes? This condition may be caused by:  Hashimoto's disease. This is a disease in which the body's disease-fighting system (immune system) attacks the thyroid gland. This is the most common cause.  Viral infections.  Pregnancy.  Certain medicines.  Birth defects.  Past radiation treatments to the head or neck for cancer.  Past treatment with radioactive iodine.  Past exposure to radiation in the environment.  Past surgical removal of part or all of the thyroid.  Problems with a gland in the center of the brain (pituitary gland).  Lack of enough iodine in the diet. What increases the risk? You are more likely to develop this condition if:  You are male.  You have a family history of thyroid conditions.  You use a medicine called lithium.  You take medicines that affect the immune system (immunosuppressants). What are the signs or symptoms? Symptoms of this condition include:  Feeling as though you have no energy (lethargy).  Not being able to tolerate cold.  Weight gain that is not explained by a change in diet or exercise habits.  Lack of appetite.  Dry skin.  Coarse hair.  Menstrual irregularity.  Slowing of thought processes.  Constipation.  Sadness or depression. How is this diagnosed? This condition may be diagnosed based on:  Your symptoms, your medical history, and a physical exam.  Blood  tests. You may also have imaging tests, such as an ultrasound or MRI. How is this treated? This condition is treated with medicine that replaces the thyroid hormones that your body does not make. After you begin treatment, it may take several weeks for symptoms to go away. Follow these instructions at home:  Take over-the-counter and prescription medicines only as told by your health care provider.  If you start taking any new medicines, tell your health care provider.  Keep all follow-up visits as told by your health care provider. This is important. ? As your condition improves, your dosage of thyroid hormone medicine may change. ? You will need to have blood tests regularly so that your health care provider can monitor your condition. Contact a health care provider if:  Your symptoms do not get better with treatment.  You are taking thyroid replacement medicine and you: ? Sweat a lot. ? Have tremors. ? Feel anxious. ? Lose weight rapidly. ? Cannot tolerate heat. ? Have emotional swings. ? Have diarrhea. ? Feel weak. Get help right away if you have:  Chest pain.  An irregular heartbeat.  A rapid heartbeat.  Difficulty breathing. Summary  Hypothyroidism is when the thyroid gland does not make enough of certain hormones (it is underactive).  When the thyroid is underactive, it produces too little of the hormones thyroxine (T4) and triiodothyronine (T3).  The most common cause is Hashimoto's disease, a disease in which the body's disease-fighting system (immune system) attacks the thyroid gland. The condition can also be caused by viral infections, medicine, pregnancy, or past   radiation treatment to the head or neck.  Symptoms may include weight gain, dry skin, constipation, feeling as though you do not have energy, and not being able to tolerate cold.  This condition is treated with medicine to replace the thyroid hormones that your body does not make. This information  is not intended to replace advice given to you by your health care provider. Make sure you discuss any questions you have with your health care provider. Document Revised: 06/04/2017 Document Reviewed: 06/02/2017 Elsevier Patient Education  2020 [employer]  

## 2020-03-01 LAB — COMP. METABOLIC PANEL (12)
AST: 439 IU/L — ABNORMAL HIGH (ref 0–40)
Albumin/Globulin Ratio: 1.2 (ref 1.2–2.2)
Albumin: 4.5 g/dL (ref 3.8–4.8)
Alkaline Phosphatase: 144 IU/L — ABNORMAL HIGH (ref 48–121)
BUN/Creatinine Ratio: 9 — ABNORMAL LOW (ref 10–24)
BUN: 12 mg/dL (ref 8–27)
Bilirubin Total: 0.2 mg/dL (ref 0.0–1.2)
Calcium: 9.9 mg/dL (ref 8.6–10.2)
Chloride: 97 mmol/L (ref 96–106)
Creatinine, Ser: 1.38 mg/dL — ABNORMAL HIGH (ref 0.76–1.27)
GFR calc Af Amer: 63 mL/min/{1.73_m2} (ref >59)
GFR calc non Af Amer: 55 mL/min/{1.73_m2} — ABNORMAL LOW (ref >59)
Globulin, Total: 3.8 g/dL (ref 1.5–4.5)
Glucose: 94 mg/dL (ref 65–99)
Potassium: 4.1 mmol/L (ref 3.5–5.2)
Sodium: 135 mmol/L (ref 134–144)
Total Protein: 8.3 g/dL (ref 6.0–8.5)

## 2020-03-01 LAB — TSH: TSH: 332 u[IU]/mL — ABNORMAL HIGH (ref 0.450–4.500)

## 2020-03-01 LAB — LIPID PANEL
Chol/HDL Ratio: 4.2 ratio (ref 0.0–5.0)
Cholesterol, Total: 225 mg/dL — ABNORMAL HIGH (ref 100–199)
HDL: 53 mg/dL (ref >39)
LDL Chol Calc (NIH): 133 mg/dL — ABNORMAL HIGH (ref 0–99)
Triglycerides: 220 mg/dL — ABNORMAL HIGH (ref 0–149)
VLDL Cholesterol Cal: 39 mg/dL (ref 5–40)

## 2020-03-01 LAB — T3: T3, Total: <20 ng/dL — ABNORMAL LOW (ref 71–180)

## 2020-03-01 LAB — T4, FREE: Free T4: <0.1 ng/dL — ABNORMAL LOW (ref 0.82–1.77)

## 2020-03-04 ENCOUNTER — Other Ambulatory Visit: Payer: Self-pay | Admitting: Nurse Practitioner

## 2020-03-04 DIAGNOSIS — B182 Chronic viral hepatitis C: Secondary | ICD-10-CM

## 2020-03-05 ENCOUNTER — Other Ambulatory Visit: Payer: Self-pay | Admitting: Nurse Practitioner

## 2020-03-08 ENCOUNTER — Other Ambulatory Visit: Payer: Self-pay | Admitting: Nurse Practitioner

## 2020-03-08 DIAGNOSIS — B182 Chronic viral hepatitis C: Secondary | ICD-10-CM

## 2020-03-09 ENCOUNTER — Other Ambulatory Visit: Payer: Self-pay | Admitting: Nurse Practitioner

## 2020-03-14 ENCOUNTER — Other Ambulatory Visit: Payer: Self-pay

## 2020-03-14 MED ORDER — GABAPENTIN 600 MG PO TABS: 600.0000 mg | ORAL_TABLET | Freq: Three times a day (TID) | ORAL | 2 refills | Status: DC

## 2020-04-26 ENCOUNTER — Telehealth: Payer: Self-pay

## 2020-04-26 NOTE — Telephone Encounter (Signed)
Sent referral to [facility].  Doctor will review records and will call patient to schedule appointment

## 2020-04-29 ENCOUNTER — Other Ambulatory Visit: Payer: Self-pay | Admitting: Nurse Practitioner

## 2020-04-29 DIAGNOSIS — J449 Chronic obstructive pulmonary disease, unspecified: Secondary | ICD-10-CM

## 2020-05-04 ENCOUNTER — Other Ambulatory Visit: Payer: Self-pay | Admitting: Nurse Practitioner

## 2020-06-03 ENCOUNTER — Other Ambulatory Visit: Payer: Self-pay | Admitting: Nurse Practitioner

## 2020-06-03 ENCOUNTER — Ambulatory Visit: Payer: Medicaid Other | Admitting: Nurse Practitioner

## 2020-06-06 ENCOUNTER — Other Ambulatory Visit: Payer: Self-pay | Admitting: Nurse Practitioner

## 2020-06-06 MED ORDER — GABAPENTIN 600 MG PO TABS: 600.0000 mg | ORAL_TABLET | Freq: Three times a day (TID) | ORAL | 2 refills | Status: DC

## 2020-06-10 ENCOUNTER — Encounter: Payer: Self-pay | Admitting: Internal Medicine

## 2020-07-15 ENCOUNTER — Ambulatory Visit (INDEPENDENT_AMBULATORY_CARE_PROVIDER_SITE_OTHER): Payer: Medicaid Other | Admitting: Nurse Practitioner

## 2020-07-15 ENCOUNTER — Other Ambulatory Visit: Payer: Self-pay

## 2020-07-15 ENCOUNTER — Encounter: Payer: Self-pay | Admitting: Nurse Practitioner

## 2020-07-15 VITALS — BP 136/79 | HR 61 | Temp 98.7°F | Ht 67.0 in | Wt 160.0 lb

## 2020-07-15 DIAGNOSIS — B182 Chronic viral hepatitis C: Secondary | ICD-10-CM | POA: Diagnosis not present

## 2020-07-15 DIAGNOSIS — J449 Chronic obstructive pulmonary disease, unspecified: Secondary | ICD-10-CM | POA: Diagnosis not present

## 2020-07-15 DIAGNOSIS — E039 Hypothyroidism, unspecified: Secondary | ICD-10-CM | POA: Diagnosis not present

## 2020-07-15 DIAGNOSIS — Z716 Tobacco abuse counseling: Secondary | ICD-10-CM | POA: Diagnosis not present

## 2020-07-15 DIAGNOSIS — Z91148 Patient's other noncompliance with medication regimen for other reason: Secondary | ICD-10-CM

## 2020-07-15 DIAGNOSIS — E781 Pure hyperglyceridemia: Secondary | ICD-10-CM | POA: Diagnosis not present

## 2020-07-15 DIAGNOSIS — Z9114 Patient's other noncompliance with medication regimen: Secondary | ICD-10-CM | POA: Diagnosis not present

## 2020-07-15 MED ORDER — GABAPENTIN 600 MG PO TABS: 600.0000 mg | ORAL_TABLET | Freq: Three times a day (TID) | ORAL | 3 refills | Status: DC

## 2020-07-15 MED ORDER — ROSUVASTATIN CALCIUM 20 MG PO TABS: 20.0000 mg | ORAL_TABLET | Freq: Every day | ORAL | 3 refills | Status: DC

## 2020-07-15 MED ORDER — LEVOTHYROXINE SODIUM 50 MCG PO TABS: ORAL_TABLET | ORAL | 2 refills | Status: DC

## 2020-07-15 MED ORDER — ALBUTEROL SULFATE HFA 108 (90 BASE) MCG/ACT IN AERS
INHALATION_SPRAY | RESPIRATORY_TRACT | 3 refills | Status: DC
Start: 2020-07-15 — End: 2020-12-24

## 2020-07-15 MED ORDER — BUDESONIDE-FORMOTEROL FUMARATE 80-4.5 MCG/ACT IN AERO
2.0000 | INHALATION_SPRAY | Freq: Two times a day (BID) | RESPIRATORY_TRACT | 3 refills | Status: DC
Start: 2020-07-15 — End: 2021-02-19

## 2020-07-15 NOTE — Progress Notes (Signed)
[facility] [address] [place], [place]  [address] Phone:  [phone]   Fax:  [phone]   Established Patient Office Visit  Subjective:  Patient ID: Javier PippinRandy L Bridges, male    DOB: 30-Jul-1960  Age: 60 y.o. MRN: [id number]  CC:  Chief Complaint  Patient presents with  . Follow-up    Follow up     HPI Javier PippinRandy L Bridges presents for follow up. He  has a past medical history of Bradycardia, Chronic back pain, Hepatitis C, chronic (HCC), Hypothyroidism, and Tobacco dependence.   Hypothyroidism Javier PippinRandy L Javier Bridges is a 60 y.o. male who presents for follow up of hypothyroidism. Current symptoms: none . Patient denies change in energy level, diarrhea, heat / cold intolerance, nervousness, palpitations and weight changes. Symptoms have stabilized. He needs to be on a hight dose of the levothyroxine however he does not like the way it makes him feel. He likes to stay on the lev 50 mcg daily and he does not do this everyday.    Past Medical History:  Diagnosis Date  . Bradycardia   . Chronic back pain   . Hepatitis C, chronic (HCC)    tx with harvoni  . Hypothyroidism   . Tobacco dependence     Past Surgical History:  Procedure Laterality Date  . ANTERIOR LATERAL LUMBAR FUSION 4 LEVELS N/A 09/13/2017   Procedure: Lumbar one-two Lumbar two-three Lumbar three-four Lumbar four-five Anterolateral decompression/fusion with [name]/Mazor;  Surgeon: Barnett AbuElsner, Henry, MD;  [facility];  Service: Neurosurgery;  Laterality: N/A;  . APPLICATION OF ROBOTIC ASSISTANCE FOR SPINAL PROCEDURE N/A 09/13/2017   Procedure: lumbar one to lumbar five percutaneous screws APPLICATION OF ROBOTIC ASSISTANCE FOR SPINAL PROCEDURE;  Surgeon: Barnett AbuElsner, Henry, MD;  Location: [facility] OR;  Service: Neurosurgery;  Laterality: N/A;  . Feet Surgery  1970  . MULTIPLE TOOTH EXTRACTIONS      Family History  Problem Relation Age of Onset  . Hypertension Mother   . Cancer Father     Social History   Socioeconomic  History  . Marital status: Single    Spouse name: Not on file  . Number of children: Not on file  . Years of education: Not on file  . Highest education level: Not on file  Occupational History  . Not on file  Tobacco Use  . Smoking status: Current Every Day Smoker    Packs/day: 0.25    Types: Cigarettes  . Smokeless tobacco: Never Used  Vaping Use  . Vaping Use: Never used  Substance and Sexual Activity  . Alcohol use: Yes    Alcohol/week: 14.0 standard drinks    Types: 14 Cans of beer per week    Comment: socially  . Drug use: Yes    Types: Marijuana    Comment: last time was 2/28  . Sexual activity: Never  Other Topics Concern  . Not on file  Social History Narrative  . Not on file   Social Determinants of Health   Financial Resource Strain: Not on file  Food Insecurity: Not on file  Transportation Needs: Not on file  Physical Activity: Not on file  Stress: Not on file  Social Connections: Not on file  Intimate Partner Violence: Not on file    Outpatient Medications Prior to Visit  Medication Sig Dispense Refill  . albuterol (PROAIR HFA) 108 (90 Base) MCG/ACT inhaler INHALE 2 PUFFS INTO THE LUNGS EVERY 6 HOURS AS NEEDED FOR WHEEZING OR SHORTNESS OF BREATH 8.5  g 3  . gabapentin (NEURONTIN) 600 MG tablet Take 1 tablet (600 mg total) by mouth 3 (three) times daily. 90 tablet 2  . levothyroxine (SYNTHROID) 50 MCG tablet TAKE 1 TABLET(50 MCG) BY MOUTH DAILY BEFORE BREAKFAST 30 tablet 2  . rosuvastatin (CRESTOR) 20 MG tablet Take 1 tablet (20 mg total) by mouth daily. 90 tablet 3  . SYMBICORT 80-4.5 MCG/ACT inhaler INHALE 2 PUFFS INTO THE LUNGS TWICE DAILY 10.2 g 3   No facility-administered medications prior to visit.    Allergies  Allergen Reactions  . Tricor [Fenofibrate]     Chest pain    ROS Review of Systems  Constitutional: Negative for chills and fever.  Respiratory: Negative for shortness of breath (uses inhaler occasionally with chest tightnes).        Objective:    Physical Exam Constitutional:      General: He is not in acute distress.    Appearance: He is not ill-appearing, toxic-appearing or diaphoretic.  HENT:     Head: Normocephalic and atraumatic.     Nose: Nose normal.     Mouth/Throat:     Mouth: Mucous membranes are moist.  Cardiovascular:     Rate and Rhythm: Normal rate and regular rhythm.     Pulses: Normal pulses.     Heart sounds: Normal heart sounds.  Pulmonary:     Effort: Pulmonary effort is normal.     Breath sounds: Normal breath sounds.  Abdominal:     General: Abdomen is flat. Bowel sounds are normal.     Palpations: Abdomen is soft.  Musculoskeletal:        General: Normal range of motion.     Cervical back: Normal range of motion.  Skin:    General: Skin is warm and dry.     Capillary Refill: Capillary refill takes less than 2 seconds.  Neurological:     General: No focal deficit present.     Mental Status: He is alert and oriented to person, place, and time.  Psychiatric:        Mood and Affect: Mood normal.        Behavior: Behavior normal.        Thought Content: Thought content normal.        Judgment: Judgment normal.     BP 136/79 (BP Location: Left Arm, Patient Position: Sitting, Cuff Size: Normal)   Pulse 61   Temp 98.7 F (37.1 C) (Temporal)   Wt 160 lb (72.6 kg)   SpO2 99%   BMI 25.06 kg/m  Wt Readings from Last 3 Encounters:  07/15/20 160 lb (72.6 kg)  02/29/20 162 lb (73.5 kg)  11/29/19 159 lb 9.6 oz (72.4 kg)     There are no preventive care reminders to display for this patient.  There are no preventive care reminders to display for this patient.  Lab Results  Component Value Date   TSH 332.000 (H) 02/29/2020   Lab Results  Component Value Date   WBC 8.3 09/01/2019   HGB 16.1 09/01/2019   HCT 46.0 09/01/2019   MCV 88 09/01/2019   PLT 280 09/01/2019   Lab Results  Component Value Date   NA 135 02/29/2020   K 4.1 02/29/2020   CO2 23 02/28/2018    GLUCOSE 94 02/29/2020   BUN 12 02/29/2020   CREATININE 1.38 (H) 02/29/2020   BILITOT 0.2 02/29/2020   ALKPHOS 144 (H) 02/29/2020   AST 439 (H) 02/29/2020   ALT 5 02/28/2018  PROT 8.3 02/29/2020   ALBUMIN 4.5 02/29/2020   CALCIUM 9.9 02/29/2020   ANIONGAP 8 09/14/2017   Lab Results  Component Value Date   CHOL 225 (H) 02/29/2020   Lab Results  Component Value Date   HDL 53 02/29/2020   Lab Results  Component Value Date   LDLCALC 133 (H) 02/29/2020   Lab Results  Component Value Date   TRIG 220 (H) 02/29/2020   Lab Results  Component Value Date   CHOLHDL 4.2 02/29/2020   Lab Results  Component Value Date   HGBA1C 5.9 09/30/2016      Assessment & Plan:   Problem List Items Addressed This Visit      Respiratory   COPD (chronic obstructive pulmonary disease) (HCC) Stable will continue with current regimen   Relevant Medications   budesonide-formoterol (SYMBICORT) 80-4.5 MCG/ACT inhaler   albuterol (PROAIR HFA) 108 (90 Base) MCG/ACT inhaler     Digestive   Chronic hepatitis C without hepatic coma (HCC) Elevated Liver enzymes was called to follow up with [facility] he is not sure why. Encouraged pt to follow up.    Relevant Orders   Comp. Metabolic Panel (12)     Endocrine   Acquired hypothyroidism Uncontrolled due to noncompliance with levothyroxine  Encouraged to take mediation daily and to adhere to recommendation with does changes.    Relevant Medications   levothyroxine (SYNTHROID) 50 MCG tablet   Other Relevant Orders   TSH     Other   Hypertriglyceridemia Persistent TC 225, Tri 220, LDL 133   Relevant Medications   rosuvastatin (CRESTOR) 20 MG tablet   Other Relevant Orders   Lipid panel    Other Visit Diagnoses    Non compliance w medication regimen    -  Primary   Tobacco abuse counseling     Persistent improving smoking less       Meds ordered this encounter  Medications  . rosuvastatin (CRESTOR) 20 MG tablet    Sig: Take 1 tablet  (20 mg total) by mouth daily.    Dispense:  90 tablet    Refill:  3    Order Specific Question:   Supervising Provider    Answer:   Quentin Angst [id number]  . budesonide-formoterol (SYMBICORT) 80-4.5 MCG/ACT inhaler    Sig: Inhale 2 puffs into the lungs 2 (two) times daily.    Dispense:  10.2 g    Refill:  3    Order Specific Question:   Supervising Provider    Answer:   Quentin Angst [id number]  . levothyroxine (SYNTHROID) 50 MCG tablet    Sig: TAKE 1 TABLET(50 MCG) BY MOUTH DAILY BEFORE BREAKFAST    Dispense:  30 tablet    Refill:  2    Order Specific Question:   Supervising Provider    Answer:   Quentin Angst [id number]  . albuterol (PROAIR HFA) 108 (90 Base) MCG/ACT inhaler    Sig: INHALE 2 PUFFS INTO THE LUNGS EVERY 6 HOURS AS NEEDED FOR WHEEZING OR SHORTNESS OF BREATH    Dispense:  8.5 g    Refill:  3    Order Specific Question:   Supervising Provider    Answer:   Quentin Angst [id number]  . gabapentin (NEURONTIN) 600 MG tablet    Sig: Take 1 tablet (600 mg total) by mouth 3 (three) times daily.    Dispense:  270 tablet    Refill:  3    Order Specific Question:  Supervising Provider    Answer:   Quentin Angst [[id number]]    Follow-up: Return in about 3 months (around 10/13/2020).    Barbette Merino, NP

## 2020-07-15 NOTE — Patient Instructions (Addendum)
Hypothyroidism  Hypothyroidism is when the thyroid gland does not make enough of certain hormones (it is underactive). The thyroid gland is a small gland located in the lower front part of the neck, just in front of the windpipe (trachea). This gland makes hormones that help control how the body uses food for energy (metabolism) as well as how the heart and brain function. These hormones also play a role in keeping your bones strong. When the thyroid is underactive, it produces too little of the hormones thyroxine (T4) and triiodothyronine (T3). What are the causes? This condition may be caused by:  Hashimoto's disease. This is a disease in which the body's disease-fighting system (immune system) attacks the thyroid gland. This is the most common cause.  Viral infections.  Pregnancy.  Certain medicines.  Birth defects.  Past radiation treatments to the head or neck for cancer.  Past treatment with radioactive iodine.  Past exposure to radiation in the environment.  Past surgical removal of part or all of the thyroid.  Problems with a gland in the center of the brain (pituitary gland).  Lack of enough iodine in the diet. What increases the risk? You are more likely to develop this condition if:  You are male.  You have a family history of thyroid conditions.  You use a medicine called lithium.  You take medicines that affect the immune system (immunosuppressants). What are the signs or symptoms? Symptoms of this condition include:  Feeling as though you have no energy (lethargy).  Not being able to tolerate cold.  Weight gain that is not explained by a change in diet or exercise habits.  Lack of appetite.  Dry skin.  Coarse hair.  Menstrual irregularity.  Slowing of thought processes.  Constipation.  Sadness or depression. How is this diagnosed? This condition may be diagnosed based on:  Your symptoms, your medical history, and a physical exam.  Blood  tests. You may also have imaging tests, such as an ultrasound or MRI. How is this treated? This condition is treated with medicine that replaces the thyroid hormones that your body does not make. After you begin treatment, it may take several weeks for symptoms to go away. Follow these instructions at home:  Take over-the-counter and prescription medicines only as told by your health care provider.  If you start taking any new medicines, tell your health care provider.  Keep all follow-up visits as told by your health care provider. This is important. ? As your condition improves, your dosage of thyroid hormone medicine may change. ? You will need to have blood tests regularly so that your health care provider can monitor your condition. Contact a health care provider if:  Your symptoms do not get better with treatment.  You are taking thyroid hormone replacement medicine and you: ? Sweat a lot. ? Have tremors. ? Feel anxious. ? Lose weight rapidly. ? Cannot tolerate heat. ? Have emotional swings. ? Have diarrhea. ? Feel weak. Get help right away if you have:  Chest pain.  An irregular heartbeat.  A rapid heartbeat.  Difficulty breathing. Summary  Hypothyroidism is when the thyroid gland does not make enough of certain hormones (it is underactive).  When the thyroid is underactive, it produces too little of the hormones thyroxine (T4) and triiodothyronine (T3).  The most common cause is Hashimoto's disease, a disease in which the body's disease-fighting system (immune system) attacks the thyroid gland. The condition can also be caused by viral infections, medicine, pregnancy, or   past radiation treatment to the head or neck.  Symptoms may include weight gain, dry skin, constipation, feeling as though you do not have energy, and not being able to tolerate cold.  This condition is treated with medicine to replace the thyroid hormones that your body does not make. This  information is not intended to replace advice given to you by your health care provider. Make sure you discuss any questions you have with your health care provider. Document Revised: 03/22/2020 Document Reviewed: 03/07/2020 Elsevier Patient Education  2021 [employer] Inc. Preventing High Cholesterol Cholesterol is a white, waxy substance similar to fat that the human body needs to help build cells. The liver makes all the cholesterol that a person's body needs. Having high cholesterol (hypercholesterolemia) increases your risk for heart disease and stroke. Extra or excess cholesterol comes from the food that you eat. High cholesterol can often be prevented with diet and lifestyle changes. If you already have high cholesterol, you can control it with diet, lifestyle changes, and medicines. How can high cholesterol affect me? If you have high cholesterol, fatty deposits (plaques) may build up on the walls of your blood vessels. The blood vessels that carry blood away from your heart are called arteries. Plaques make the arteries narrower and stiffer. This in turn can:  Restrict or block blood flow and cause blood clots to form.  Increase your risk for heart attack and stroke. What can increase my risk for high cholesterol? This condition is more likely to develop in people who:  Eat foods that are high in saturated fat or cholesterol. Saturated fat is mostly found in foods that come from animal sources.  Are overweight.  Are not getting enough exercise.  Have a family history of high cholesterol (familial hypercholesterolemia). What actions can I take to prevent this? Nutrition  Eat less saturated fat.  Avoid trans fats (partially hydrogenated oils). These are often found in margarine and in some baked goods, fried foods, and snacks bought in packages.  Avoid precooked or cured meat, such as bacon, sausages, or meat loaves.  Avoid foods and drinks that have added sugars.  Eat more  fruits, vegetables, and whole grains.  Choose healthy sources of protein, such as fish, poultry, lean cuts of red meat, beans, peas, lentils, and nuts.  Choose healthy sources of fat, such as: ? Nuts. ? Vegetable oils, especially olive oil. ? Fish that have healthy fats, such as omega-3 fatty acids. These fish include mackerel or salmon.   Lifestyle  Lose weight if you are overweight. Maintaining a healthy body mass index (BMI) can help prevent or control high cholesterol. It can also lower your risk for diabetes and high blood pressure. Ask your health care provider to help you with a diet and exercise plan to lose weight safely.  Do not use any products that contain nicotine or tobacco, such as cigarettes, e-cigarettes, and chewing tobacco. If you need help quitting, ask your health care provider. Alcohol use  Do not drink alcohol if: ? Your health care provider tells you not to drink. ? You are pregnant, may be pregnant, or are planning to become pregnant.  If you drink alcohol: ? Limit how much you use to:  0-1 drink a day for women.  0-2 drinks a day for men. ? Be aware of how much alcohol is in your drink. In the [place] one drink equals one 12 oz bottle of beer (355 mL), one 5 oz glass of wine (148 mL), or   one 1 oz glass of hard liquor (44 mL). Activity  Get enough exercise. Do exercises as told by your health care provider.  Each week, do at least 150 minutes of exercise that takes a medium level of effort (moderate-intensity exercise). This kind of exercise: ? Makes your heart beat faster while allowing you to still be able to talk. ? Can be done in short sessions several times a day or longer sessions a few times a week. For example, on 5 days each week, you could walk fast or ride your bike 3 times a day for 10 minutes each time.   Medicines  Your health care provider may recommend medicines to help lower cholesterol. This may be a medicine to lower the amount of  cholesterol that your liver makes. You may need medicine if: ? Diet and lifestyle changes have not lowered your cholesterol enough. ? You have high cholesterol and other risk factors for heart disease or stroke.  Take over-the-counter and prescription medicines only as told by your health care provider. General information  Manage your risk factors for high cholesterol. Talk with your health care provider about all your risk factors and how to lower your risk.  Manage other conditions that you have, such as diabetes or high blood pressure (hypertension).  Have blood tests to check your cholesterol levels at regular points in time as told by your health care provider.  Keep all follow-up visits as told by your health care provider. This is important. Where to find more information  American Heart Association: www.heart.org  National Heart, Lung, and Blood Institute: www.nhlbi.nih.gov Summary  High cholesterol increases your risk for heart disease and stroke. By keeping your cholesterol level low, you can reduce your risk for these conditions.  High cholesterol can often be prevented with diet and lifestyle changes.  Work with your health care provider to manage your risk factors, and have your blood tested regularly. This information is not intended to replace advice given to you by your health care provider. Make sure you discuss any questions you have with your health care provider. Document Revised: 04/04/2019 Document Reviewed: 04/04/2019 Elsevier Patient Education  2021 Elsevier Inc.  

## 2020-07-16 LAB — LIPID PANEL
Chol/HDL Ratio: 3.5 ratio (ref 0.0–5.0)
Cholesterol, Total: 168 mg/dL (ref 100–199)
HDL: 48 mg/dL (ref >39)
LDL Chol Calc (NIH): 97 mg/dL (ref 0–99)
Triglycerides: 129 mg/dL (ref 0–149)
VLDL Cholesterol Cal: 23 mg/dL (ref 5–40)

## 2020-07-16 LAB — COMP. METABOLIC PANEL (12)
AST: 48 IU/L — ABNORMAL HIGH (ref 0–40)
Albumin/Globulin Ratio: 1.3 (ref 1.2–2.2)
Albumin: 4.4 g/dL (ref 3.8–4.8)
Alkaline Phosphatase: 87 IU/L (ref 44–121)
BUN/Creatinine Ratio: 10 (ref 10–24)
BUN: 13 mg/dL (ref 8–27)
Bilirubin Total: 0.3 mg/dL (ref 0.0–1.2)
Calcium: 9.9 mg/dL (ref 8.6–10.2)
Chloride: 104 mmol/L (ref 96–106)
Creatinine, Ser: 1.3 mg/dL — ABNORMAL HIGH (ref 0.76–1.27)
GFR calc Af Amer: 68 mL/min/{1.73_m2} (ref >59)
GFR calc non Af Amer: 59 mL/min/{1.73_m2} — ABNORMAL LOW (ref >59)
Globulin, Total: 3.4 g/dL (ref 1.5–4.5)
Glucose: 94 mg/dL (ref 65–99)
Potassium: 4.3 mmol/L (ref 3.5–5.2)
Sodium: 140 mmol/L (ref 134–144)
Total Protein: 7.8 g/dL (ref 6.0–8.5)

## 2020-07-16 LAB — TSH: TSH: 123 u[IU]/mL — ABNORMAL HIGH (ref 0.450–4.500)

## 2020-10-14 ENCOUNTER — Ambulatory Visit: Payer: Medicaid Other | Admitting: Nurse Practitioner

## 2020-12-24 ENCOUNTER — Other Ambulatory Visit: Payer: Self-pay | Admitting: Nurse Practitioner

## 2020-12-24 DIAGNOSIS — J449 Chronic obstructive pulmonary disease, unspecified: Secondary | ICD-10-CM

## 2020-12-24 MED ORDER — ALBUTEROL SULFATE HFA 108 (90 BASE) MCG/ACT IN AERS: INHALATION_SPRAY | RESPIRATORY_TRACT | 3 refills | Status: DC

## 2021-02-03 ENCOUNTER — Other Ambulatory Visit: Payer: Self-pay

## 2021-02-03 MED ORDER — LEVOTHYROXINE SODIUM 50 MCG PO TABS: ORAL_TABLET | ORAL | 2 refills | Status: DC

## 2021-02-07 ENCOUNTER — Ambulatory Visit: Payer: Medicaid Other | Attending: Nurse Practitioner | Admitting: Nurse Practitioner

## 2021-02-07 ENCOUNTER — Telehealth: Payer: Self-pay | Admitting: Nurse Practitioner

## 2021-02-07 ENCOUNTER — Other Ambulatory Visit: Payer: Self-pay

## 2021-02-07 NOTE — Telephone Encounter (Signed)
Attempted to call patient x 3 attempts for virtual visit. No answer. No available voice mail.

## 2021-02-19 ENCOUNTER — Other Ambulatory Visit: Payer: Self-pay

## 2021-02-19 DIAGNOSIS — J449 Chronic obstructive pulmonary disease, unspecified: Secondary | ICD-10-CM

## 2021-02-19 MED ORDER — BUDESONIDE-FORMOTEROL FUMARATE 80-4.5 MCG/ACT IN AERO: 2.0000 | INHALATION_SPRAY | Freq: Two times a day (BID) | RESPIRATORY_TRACT | 3 refills | Status: DC

## 2021-08-25 ENCOUNTER — Ambulatory Visit (INDEPENDENT_AMBULATORY_CARE_PROVIDER_SITE_OTHER): Payer: Medicaid Other | Admitting: Nurse Practitioner

## 2021-08-25 ENCOUNTER — Other Ambulatory Visit: Payer: Self-pay

## 2021-08-25 ENCOUNTER — Encounter: Payer: Self-pay | Admitting: Nurse Practitioner

## 2021-08-25 VITALS — BP 107/80 | HR 63 | Temp 98.5°F | Ht 67.0 in | Wt 162.2 lb

## 2021-08-25 DIAGNOSIS — Z972 Presence of dental prosthetic device (complete) (partial): Secondary | ICD-10-CM | POA: Diagnosis not present

## 2021-08-25 DIAGNOSIS — E039 Hypothyroidism, unspecified: Secondary | ICD-10-CM | POA: Diagnosis not present

## 2021-08-25 DIAGNOSIS — E781 Pure hyperglyceridemia: Secondary | ICD-10-CM | POA: Diagnosis not present

## 2021-08-25 DIAGNOSIS — K0889 Other specified disorders of teeth and supporting structures: Secondary | ICD-10-CM

## 2021-08-25 MED ORDER — LEVOTHYROXINE SODIUM 50 MCG PO TABS: ORAL_TABLET | ORAL | 0 refills | Status: DC

## 2021-08-25 MED ORDER — GABAPENTIN 600 MG PO TABS: 600.0000 mg | ORAL_TABLET | Freq: Three times a day (TID) | ORAL | 0 refills | Status: DC

## 2021-08-25 NOTE — Patient Instructions (Signed)

## 2021-08-25 NOTE — Progress Notes (Signed)
[facility] Patient [facility] [address] [place], [place]  [address] Phone:  [phone]   Fax:  [phone]   Established Patient Office Visit  Subjective:  Patient ID: Javier Bridges, male    DOB: Sep 26, 1960  Age: 61 y.o. MRN: [id number]  CC:  Chief Complaint  Patient presents with   Follow-up    Pt is here today for his 3 month follow up visit no concerns or issues to discuss today.    HPI Javier Bridges presents for follow up. He  has a past medical history of Bradycardia, Chronic back pain, Hepatitis C, chronic (HCC), Hypothyroidism, and Tobacco dependence.   Hypothyroidism Javier Bridges is a 61 y.o. male who presents for follow up of hypothyroidism. Current symptoms: none . Patient denies change in energy level, diarrhea, heat / cold intolerance, nervousness, and palpitations. Symptoms have stabilized.He reports being off his medication for a least a week. This has been due to transportation and being in another [place].   Past Medical History:  Diagnosis Date   Bradycardia    Chronic back pain    Hepatitis C, chronic (HCC)    tx with harvoni   Hypothyroidism    Tobacco dependence     Past Surgical History:  Procedure Laterality Date   ANTERIOR LATERAL LUMBAR FUSION 4 LEVELS N/A 09/13/2017   Procedure: Lumbar one-two Lumbar two-three Lumbar three-four Lumbar four-five Anterolateral decompression/fusion with [name];  Surgeon: [name], MD;  Location: [facility] OR;  Service: Neurosurgery;  Laterality: N/A;   APPLICATION OF ROBOTIC ASSISTANCE FOR SPINAL PROCEDURE N/A 09/13/2017   Procedure: lumbar one to lumbar five percutaneous screws APPLICATION OF ROBOTIC ASSISTANCE FOR SPINAL PROCEDURE;  Surgeon: [name], MD;  Location: [facility] OR;  Service: Neurosurgery;  Laterality: N/A;   Feet Surgery  1970   MULTIPLE TOOTH EXTRACTIONS      Family History  Problem Relation Age of Onset   Hypertension Mother    Cancer Father     Social History   Socioeconomic History    Marital status: Single    Spouse name: Not on file   Number of children: Not on file   Years of education: Not on file   Highest education level: Not on file  Occupational History   Not on file  Tobacco Use   Smoking status: Every Day    Packs/day: 0.25    Types: Cigarettes   Smokeless tobacco: Never  Vaping Use   Vaping Use: Never used  Substance and Sexual Activity   Alcohol use: Yes    Alcohol/week: 14.0 standard drinks    Types: 14 Cans of beer per week    Comment: socially   Drug use: Yes    Types: Marijuana    Comment: last time was 2/28   Sexual activity: Never  Other Topics Concern   Not on file  Social History Narrative   Not on file   Social Determinants of Health   Financial Resource Strain: Not on file  Food Insecurity: Not on file  Transportation Needs: Not on file  Physical Activity: Not on file  Stress: Not on file  Social Connections: Not on file  Intimate Partner Violence: Not on file    Outpatient Medications Prior to Visit  Medication Sig Dispense Refill   albuterol (PROAIR HFA) 108 (90 Base) MCG/ACT inhaler INHALE 2 PUFFS INTO THE LUNGS EVERY 6 HOURS AS NEEDED FOR WHEEZING OR SHORTNESS OF BREATH 8.5 g 3   budesonide-formoterol (SYMBICORT) 80-4.5 MCG/ACT inhaler  Inhale 2 puffs into the lungs 2 (two) times daily. 10.2 g 3   levothyroxine (SYNTHROID) 50 MCG tablet TAKE 1 TABLET(50 MCG) BY MOUTH DAILY BEFORE BREAKFAST 30 tablet 2   rosuvastatin (CRESTOR) 20 MG tablet Take 1 tablet (20 mg total) by mouth daily. 90 tablet 3   gabapentin (NEURONTIN) 600 MG tablet Take 1 tablet (600 mg total) by mouth 3 (three) times daily. 270 tablet 3   No facility-administered medications prior to visit.    Allergies  Allergen Reactions   Tricor [Fenofibrate]     Chest pain    ROS Review of Systems  HENT:         Dentures not fitting well.      Objective:    Physical Exam HENT:     Head: Normocephalic and atraumatic.     Nose: Nose normal.      Mouth/Throat:     Mouth: Mucous membranes are moist.  Cardiovascular:     Rate and Rhythm: Normal rate and regular rhythm.     Pulses: Normal pulses.     Heart sounds: Normal heart sounds.  Pulmonary:     Effort: Pulmonary effort is normal.     Breath sounds: Normal breath sounds.  Abdominal:     Comments: hypoactive  Musculoskeletal:        General: Normal range of motion.     Cervical back: Normal range of motion.     Right lower leg: No edema.     Left lower leg: No edema.  Skin:    General: Skin is warm and dry.     Capillary Refill: Capillary refill takes less than 2 seconds.  Neurological:     General: No focal deficit present.     Mental Status: He is alert and oriented to person, place, and time.  Psychiatric:        Mood and Affect: Mood normal.        Behavior: Behavior normal.        Thought Content: Thought content normal.        Judgment: Judgment normal.    BP 107/80    Pulse 63    Temp 98.5 F (36.9 C)    Ht 5\' 7"  (1.702 m)    Wt 162 lb 3.2 oz (73.6 kg)    SpO2 97%    BMI 25.40 kg/m  Wt Readings from Last 3 Encounters:  08/25/21 162 lb 3.2 oz (73.6 kg)  07/15/20 160 lb (72.6 kg)  02/29/20 162 lb (73.5 kg)     There are no preventive care reminders to display for this patient.   There are no preventive care reminders to display for this patient.  Lab Results  Component Value Date   TSH 123.000 (H) 07/15/2020   Lab Results  Component Value Date   WBC 8.3 09/01/2019   HGB 16.1 09/01/2019   HCT 46.0 09/01/2019   MCV 88 09/01/2019   PLT 280 09/01/2019   Lab Results  Component Value Date   NA 140 07/15/2020   K 4.3 07/15/2020   CO2 23 02/28/2018   GLUCOSE 94 07/15/2020   BUN 13 07/15/2020   CREATININE 1.30 (H) 07/15/2020   BILITOT 0.3 07/15/2020   ALKPHOS 87 07/15/2020   AST 48 (H) 07/15/2020   ALT 5 02/28/2018   PROT 7.8 07/15/2020   ALBUMIN 4.4 07/15/2020   CALCIUM 9.9 07/15/2020   ANIONGAP 8 09/14/2017   Lab Results  Component  Value Date   CHOL 168 07/15/2020  Lab Results  Component Value Date   HDL 48 07/15/2020   Lab Results  Component Value Date   LDLCALC 97 07/15/2020   Lab Results  Component Value Date   TRIG 129 07/15/2020   Lab Results  Component Value Date   CHOLHDL 3.5 07/15/2020   Lab Results  Component Value Date   HGBA1C 5.9 09/30/2016      Assessment & Plan:   Problem List Items Addressed This Visit       Endocrine   Acquired hypothyroidism - Primary Stable  Labs pending   Relevant Medications   levothyroxine (SYNTHROID) 50 MCG tablet   Other Relevant Orders   Comp. Metabolic Panel (12)   TSH   T3   T4, free     Other   Hypertriglyceridemia Stable Continue with current regimen.  No changes warranted. Good patient compliance.   Relevant Orders   Comp. Metabolic Panel (12)   Lipid panel   Other Visit Diagnoses     Ill-fitting dentures    Worsening  List of providers provided     PSA to be completed at next visit    Meds ordered this encounter  Medications   levothyroxine (SYNTHROID) 50 MCG tablet    Sig: TAKE 1 TABLET(50 MCG) BY MOUTH DAILY BEFORE BREAKFAST    Dispense:  90 tablet    Refill:  0   gabapentin (NEURONTIN) 600 MG tablet    Sig: Take 1 tablet (600 mg total) by mouth 3 (three) times daily.    Dispense:  270 tablet    Refill:  0    Follow-up: Return in about 6 months (around 02/22/2022).    Barbette Merino, NP

## 2021-08-26 LAB — COMP. METABOLIC PANEL (12)
AST: 84 IU/L — ABNORMAL HIGH (ref 0–40)
Albumin/Globulin Ratio: 1.4 (ref 1.2–2.2)
Albumin: 4.5 g/dL (ref 3.8–4.8)
Alkaline Phosphatase: 80 IU/L (ref 44–121)
BUN/Creatinine Ratio: 8 — ABNORMAL LOW (ref 10–24)
BUN: 10 mg/dL (ref 8–27)
Bilirubin Total: 0.2 mg/dL (ref 0.0–1.2)
Calcium: 9 mg/dL (ref 8.6–10.2)
Chloride: 101 mmol/L (ref 96–106)
Creatinine, Ser: 1.24 mg/dL (ref 0.76–1.27)
Globulin, Total: 3.3 g/dL (ref 1.5–4.5)
Glucose: 91 mg/dL (ref 70–99)
Potassium: 4 mmol/L (ref 3.5–5.2)
Sodium: 139 mmol/L (ref 134–144)
Total Protein: 7.8 g/dL (ref 6.0–8.5)
eGFR: 65 mL/min/{1.73_m2} (ref >59)

## 2021-08-26 LAB — LIPID PANEL
Chol/HDL Ratio: 3.9 ratio (ref 0.0–5.0)
Cholesterol, Total: 226 mg/dL — ABNORMAL HIGH (ref 100–199)
HDL: 58 mg/dL (ref >39)
LDL Chol Calc (NIH): 134 mg/dL — ABNORMAL HIGH (ref 0–99)
Triglycerides: 194 mg/dL — ABNORMAL HIGH (ref 0–149)
VLDL Cholesterol Cal: 34 mg/dL (ref 5–40)

## 2021-08-26 LAB — TSH: TSH: 223 u[IU]/mL — ABNORMAL HIGH (ref 0.450–4.500)

## 2021-08-26 LAB — T4, FREE: Free T4: 0.25 ng/dL — ABNORMAL LOW (ref 0.82–1.77)

## 2021-08-26 LAB — T3: T3, Total: <20 ng/dL — ABNORMAL LOW (ref 71–180)

## 2021-09-24 ENCOUNTER — Other Ambulatory Visit: Payer: Self-pay

## 2021-09-24 DIAGNOSIS — E781 Pure hyperglyceridemia: Secondary | ICD-10-CM

## 2021-09-25 MED ORDER — ROSUVASTATIN CALCIUM 20 MG PO TABS: 20.0000 mg | ORAL_TABLET | Freq: Every day | ORAL | 0 refills | Status: DC

## 2021-11-03 ENCOUNTER — Telehealth: Payer: Self-pay | Admitting: Nurse Practitioner

## 2021-11-03 NOTE — Telephone Encounter (Signed)
[facility] [place] requesting refills on Symbicort 80/4.30mcg  ?

## 2021-12-23 ENCOUNTER — Other Ambulatory Visit: Payer: Self-pay

## 2021-12-23 DIAGNOSIS — E781 Pure hyperglyceridemia: Secondary | ICD-10-CM

## 2021-12-23 DIAGNOSIS — E039 Hypothyroidism, unspecified: Secondary | ICD-10-CM

## 2021-12-23 MED ORDER — LEVOTHYROXINE SODIUM 50 MCG PO TABS: ORAL_TABLET | ORAL | 0 refills | Status: DC

## 2021-12-23 MED ORDER — ROSUVASTATIN CALCIUM 20 MG PO TABS: 20.0000 mg | ORAL_TABLET | Freq: Every day | ORAL | 0 refills | Status: DC

## 2022-02-23 ENCOUNTER — Ambulatory Visit (INDEPENDENT_AMBULATORY_CARE_PROVIDER_SITE_OTHER): Payer: Medicaid Other | Admitting: Nurse Practitioner

## 2022-02-23 ENCOUNTER — Encounter: Payer: Self-pay | Admitting: Nurse Practitioner

## 2022-02-23 ENCOUNTER — Ambulatory Visit: Payer: Medicaid Other | Admitting: Nurse Practitioner

## 2022-02-23 VITALS — BP 140/96 | HR 53 | Resp 18

## 2022-02-23 DIAGNOSIS — J449 Chronic obstructive pulmonary disease, unspecified: Secondary | ICD-10-CM

## 2022-02-23 DIAGNOSIS — E039 Hypothyroidism, unspecified: Secondary | ICD-10-CM | POA: Diagnosis not present

## 2022-02-23 DIAGNOSIS — Z0000 Encounter for general adult medical examination without abnormal findings: Secondary | ICD-10-CM | POA: Diagnosis not present

## 2022-02-23 DIAGNOSIS — E781 Pure hyperglyceridemia: Secondary | ICD-10-CM

## 2022-02-23 MED ORDER — ROSUVASTATIN CALCIUM 20 MG PO TABS: 20.0000 mg | ORAL_TABLET | Freq: Every day | ORAL | 0 refills | Status: DC

## 2022-02-23 MED ORDER — GABAPENTIN 600 MG PO TABS: 600.0000 mg | ORAL_TABLET | Freq: Three times a day (TID) | ORAL | 0 refills | Status: DC

## 2022-02-23 MED ORDER — ALBUTEROL SULFATE HFA 108 (90 BASE) MCG/ACT IN AERS: INHALATION_SPRAY | RESPIRATORY_TRACT | 3 refills | Status: DC

## 2022-02-23 MED ORDER — BUDESONIDE-FORMOTEROL FUMARATE 80-4.5 MCG/ACT IN AERO: 2.0000 | INHALATION_SPRAY | Freq: Two times a day (BID) | RESPIRATORY_TRACT | 3 refills | Status: DC

## 2022-02-23 NOTE — Assessment & Plan Note (Signed)
-   Thyroid Panel With TSH  2. Chronic obstructive pulmonary disease, unspecified COPD type (HCC)  - albuterol (PROAIR HFA) 108 (90 Base) MCG/ACT inhaler; INHALE 2 PUFFS INTO THE LUNGS EVERY 6 HOURS AS NEEDED FOR WHEEZING OR SHORTNESS OF BREATH  Dispense: 8.5 g; Refill: 3 - budesonide-formoterol (SYMBICORT) 80-4.5 MCG/ACT inhaler; Inhale 2 puffs into the lungs 2 (two) times daily.  Dispense: 10.2 g; Refill: 3  3. Hypertriglyceridemia  - rosuvastatin (CRESTOR) 20 MG tablet; Take 1 tablet (20 mg total) by mouth daily.  Dispense: 90 tablet; Refill: 0 - Lipid Panel  4. Routine health maintenance  - CBC - Comprehensive metabolic panel - PSA   Follow up:  Follow up in 6 weeks or sooner if needed

## 2022-02-23 NOTE — Patient Instructions (Signed)
1. Acquired hypothyroidism  - Thyroid Panel With TSH  2. Chronic obstructive pulmonary disease, unspecified COPD type (HCC)  - albuterol (PROAIR HFA) 108 (90 Base) MCG/ACT inhaler; INHALE 2 PUFFS INTO THE LUNGS EVERY 6 HOURS AS NEEDED FOR WHEEZING OR SHORTNESS OF BREATH  Dispense: 8.5 g; Refill: 3 - budesonide-formoterol (SYMBICORT) 80-4.5 MCG/ACT inhaler; Inhale 2 puffs into the lungs 2 (two) times daily.  Dispense: 10.2 g; Refill: 3  3. Hypertriglyceridemia  - rosuvastatin (CRESTOR) 20 MG tablet; Take 1 tablet (20 mg total) by mouth daily.  Dispense: 90 tablet; Refill: 0 - Lipid Panel  4. Routine health maintenance  - CBC - Comprehensive metabolic panel - PSA   Follow up:  Follow up in 6 weeks or sooner if needed

## 2022-02-23 NOTE — Progress Notes (Signed)
@Patient  ID: , male    DOB: October 12, 1960, 61 y.o.   MRN: [id number]  Chief Complaint  Patient presents with   Follow-up    Referring provider: [id number], NP   HPI  Javier Bridges presents for follow up. He  has a past medical history of Bradycardia, Chronic back pain, Hepatitis C, chronic (HCC), Hypothyroidism, and Tobacco dependence.    Patient presents today for a follow-up visit.  This is a previous patient of Javier Bridges.  Patient states that he is noncompliant with his medications.  He is supposed to be on Synthroid but has not been taking this.  We will recheck labs today.  Refills will be sent to the pharmacy. Denies f/c/s, n/v/d, hemoptysis, PND, leg swelling Denies chest pain or edema    Allergies  Allergen Reactions   Tricor [Fenofibrate]     Chest pain    Immunization History  Administered Date(s) Administered   Hepatitis A, Adult 12/15/2016   Pneumococcal Polysaccharide-23 05/03/2015   Tdap 09/30/2016    Past Medical History:  Diagnosis Date   Bradycardia    Chronic back pain    Hepatitis C, chronic (HCC)    tx with harvoni   Hypothyroidism    Tobacco dependence     Tobacco History: Social History   Tobacco Use  Smoking Status Every Day   Packs/day: 0.25   Types: Cigarettes  Smokeless Tobacco Never   Ready to quit: Not Answered Counseling given: Not Answered   Outpatient Encounter Medications as of 02/23/2022  Medication Sig   albuterol (PROAIR HFA) 108 (90 Base) MCG/ACT inhaler INHALE 2 PUFFS INTO THE LUNGS EVERY 6 HOURS AS NEEDED FOR WHEEZING OR SHORTNESS OF BREATH   budesonide-formoterol (SYMBICORT) 80-4.5 MCG/ACT inhaler Inhale 2 puffs into the lungs 2 (two) times daily.   gabapentin (NEURONTIN) 600 MG tablet Take 1 tablet (600 mg total) by mouth 3 (three) times daily.   levothyroxine (SYNTHROID) 50 MCG tablet TAKE 1 TABLET(50 MCG) BY MOUTH DAILY BEFORE BREAKFAST   rosuvastatin (CRESTOR) 20 MG tablet Take 1 tablet (20 mg  total) by mouth daily.   [DISCONTINUED] albuterol (PROAIR HFA) 108 (90 Base) MCG/ACT inhaler INHALE 2 PUFFS INTO THE LUNGS EVERY 6 HOURS AS NEEDED FOR WHEEZING OR SHORTNESS OF BREATH   [DISCONTINUED] budesonide-formoterol (SYMBICORT) 80-4.5 MCG/ACT inhaler Inhale 2 puffs into the lungs 2 (two) times daily.   [DISCONTINUED] gabapentin (NEURONTIN) 600 MG tablet Take 1 tablet (600 mg total) by mouth 3 (three) times daily.   [DISCONTINUED] rosuvastatin (CRESTOR) 20 MG tablet Take 1 tablet (20 mg total) by mouth daily.   No facility-administered encounter medications on file as of 02/23/2022.     Review of Systems  Review of Systems  Constitutional: Negative.   HENT: Negative.    Cardiovascular: Negative.   Gastrointestinal: Negative.   Allergic/Immunologic: Negative.   Neurological: Negative.   Psychiatric/Behavioral: Negative.         Physical Exam  BP (!) 140/96   Pulse (!) 53   Resp 18   SpO2 98%   Wt Readings from Last 5 Encounters:  08/25/21 162 lb 3.2 oz (73.6 kg)  07/15/20 160 lb (72.6 kg)  02/29/20 162 lb (73.5 kg)  11/29/19 159 lb 9.6 oz (72.4 kg)  09/01/19 163 lb (73.9 kg)     Physical Exam Vitals and nursing note reviewed.  Constitutional:      General: He is not in acute distress.    Appearance: He is well-developed.  Cardiovascular:  Rate and Rhythm: Normal rate and regular rhythm.  Pulmonary:     Effort: Pulmonary effort is normal.     Breath sounds: Normal breath sounds.  Skin:    General: Skin is warm and dry.  Neurological:     Mental Status: He is alert and oriented to person, place, and time.      Lab Results:  CBC    Component Value Date/Time   WBC 8.3 09/01/2019 1059   WBC 13.8 (H) 09/14/2017 0432   RBC 5.24 09/01/2019 1059   RBC 3.77 (L) 09/14/2017 0432   HGB 16.1 09/01/2019 1059   HCT 46.0 09/01/2019 1059   PLT 280 09/01/2019 1059   MCV 88 09/01/2019 1059   MCH 30.7 09/01/2019 1059   MCH 29.2 09/14/2017 0432   MCHC 35.0  09/01/2019 1059   MCHC 33.4 09/14/2017 0432   RDW 15.3 09/01/2019 1059   LYMPHSABS 3.0 09/01/2019 1059   MONOABS 770 09/30/2016 1154   EOSABS 0.7 (H) 09/01/2019 1059   BASOSABS 0.1 09/01/2019 1059    BMET    Component Value Date/Time   NA 139 08/25/2021 1134   K 4.0 08/25/2021 1134   CL 101 08/25/2021 1134   CO2 23 02/28/2018 0841   GLUCOSE 91 08/25/2021 1134   GLUCOSE 133 (H) 09/14/2017 0432   BUN 10 08/25/2021 1134   CREATININE 1.24 08/25/2021 1134   CREATININE 0.98 02/01/2017 0917   CALCIUM 9.0 08/25/2021 1134   GFRNONAA 59 (L) 07/15/2020 1209   GFRNONAA 85 02/01/2017 0917   GFRAA 68 07/15/2020 1209   GFRAA >89 02/01/2017 0917    BNP No results found for: "BNP"  ProBNP No results found for: "PROBNP"  Imaging: No results found.   Assessment & Plan:   Acquired hypothyroidism - Thyroid Panel With TSH  2. Chronic obstructive pulmonary disease, unspecified COPD type (HCC)  - albuterol (PROAIR HFA) 108 (90 Base) MCG/ACT inhaler; INHALE 2 PUFFS INTO THE LUNGS EVERY 6 HOURS AS NEEDED FOR WHEEZING OR SHORTNESS OF BREATH  Dispense: 8.5 g; Refill: 3 - budesonide-formoterol (SYMBICORT) 80-4.5 MCG/ACT inhaler; Inhale 2 puffs into the lungs 2 (two) times daily.  Dispense: 10.2 g; Refill: 3  3. Hypertriglyceridemia  - rosuvastatin (CRESTOR) 20 MG tablet; Take 1 tablet (20 mg total) by mouth daily.  Dispense: 90 tablet; Refill: 0 - Lipid Panel  4. Routine health maintenance  - CBC - Comprehensive metabolic panel - PSA   Follow up:  Follow up in 6 weeks or sooner if needed      Ivonne Andrew, NP 02/23/2022

## 2022-02-24 LAB — COMPREHENSIVE METABOLIC PANEL
ALT: 197 IU/L — ABNORMAL HIGH (ref 0–44)
AST: 191 IU/L — ABNORMAL HIGH (ref 0–40)
Albumin/Globulin Ratio: 1.4 (ref 1.2–2.2)
Albumin: 4.5 g/dL (ref 3.9–4.9)
Alkaline Phosphatase: 80 IU/L (ref 44–121)
BUN/Creatinine Ratio: 8 — ABNORMAL LOW (ref 10–24)
BUN: 12 mg/dL (ref 8–27)
Bilirubin Total: 0.2 mg/dL (ref 0.0–1.2)
CO2: 24 mmol/L (ref 20–29)
Calcium: 9.7 mg/dL (ref 8.6–10.2)
Chloride: 97 mmol/L (ref 96–106)
Creatinine, Ser: 1.47 mg/dL — ABNORMAL HIGH (ref 0.76–1.27)
Globulin, Total: 3.2 g/dL (ref 1.5–4.5)
Glucose: 89 mg/dL (ref 70–99)
Potassium: 4.1 mmol/L (ref 3.5–5.2)
Sodium: 136 mmol/L (ref 134–144)
Total Protein: 7.7 g/dL (ref 6.0–8.5)
eGFR: 53 mL/min/{1.73_m2} — ABNORMAL LOW (ref >59)

## 2022-02-24 LAB — CBC
Hematocrit: 46.4 % (ref 37.5–51.0)
Hemoglobin: 15.6 g/dL (ref 13.0–17.7)
MCH: 31 pg (ref 26.6–33.0)
MCHC: 33.6 g/dL (ref 31.5–35.7)
MCV: 92 fL (ref 79–97)
Platelets: 259 10*3/uL (ref 150–450)
RBC: 5.04 x10E6/uL (ref 4.14–5.80)
RDW: 14.4 % (ref 11.6–15.4)
WBC: 8.2 10*3/uL (ref 3.4–10.8)

## 2022-02-24 LAB — LIPID PANEL
Chol/HDL Ratio: 6 ratio — ABNORMAL HIGH (ref 0.0–5.0)
Cholesterol, Total: 389 mg/dL — ABNORMAL HIGH (ref 100–199)
HDL: 65 mg/dL (ref >39)
LDL Chol Calc (NIH): 275 mg/dL — ABNORMAL HIGH (ref 0–99)
Triglycerides: 232 mg/dL — ABNORMAL HIGH (ref 0–149)
VLDL Cholesterol Cal: 49 mg/dL — ABNORMAL HIGH (ref 5–40)

## 2022-02-24 LAB — THYROID PANEL WITH TSH
T3 Uptake Ratio: 12 % — ABNORMAL LOW (ref 24–39)
T4, Total: <0.4 ug/dL — CL (ref 4.5–12.0)
TSH: 302 u[IU]/mL — ABNORMAL HIGH (ref 0.450–4.500)

## 2022-02-24 LAB — PSA: Prostate Specific Ag, Serum: 0.3 ng/mL (ref 0.0–4.0)

## 2022-02-25 ENCOUNTER — Other Ambulatory Visit: Payer: Self-pay | Admitting: Nurse Practitioner

## 2022-02-25 DIAGNOSIS — R7989 Other specified abnormal findings of blood chemistry: Secondary | ICD-10-CM

## 2022-02-25 DIAGNOSIS — E039 Hypothyroidism, unspecified: Secondary | ICD-10-CM

## 2022-03-28 ENCOUNTER — Other Ambulatory Visit: Payer: Self-pay | Admitting: Nurse Practitioner

## 2022-03-28 DIAGNOSIS — E781 Pure hyperglyceridemia: Secondary | ICD-10-CM

## 2022-04-06 ENCOUNTER — Ambulatory Visit: Payer: Medicaid Other | Admitting: Nurse Practitioner

## 2022-04-08 ENCOUNTER — Ambulatory Visit: Payer: Medicaid Other | Admitting: Nurse Practitioner

## 2022-04-13 ENCOUNTER — Encounter: Payer: Self-pay | Admitting: Nurse Practitioner

## 2022-05-06 ENCOUNTER — Ambulatory Visit: Payer: Medicaid Other | Admitting: Nurse Practitioner

## 2022-05-18 ENCOUNTER — Ambulatory Visit (INDEPENDENT_AMBULATORY_CARE_PROVIDER_SITE_OTHER): Payer: Medicaid Other | Admitting: Nurse Practitioner

## 2022-05-18 ENCOUNTER — Encounter: Payer: Self-pay | Admitting: Nurse Practitioner

## 2022-05-18 VITALS — BP 104/66 | HR 54 | Temp 97.6°F | Ht 67.0 in | Wt 158.0 lb

## 2022-05-18 DIAGNOSIS — Z1211 Encounter for screening for malignant neoplasm of colon: Secondary | ICD-10-CM | POA: Diagnosis not present

## 2022-05-18 DIAGNOSIS — R7989 Other specified abnormal findings of blood chemistry: Secondary | ICD-10-CM | POA: Diagnosis not present

## 2022-05-18 DIAGNOSIS — J449 Chronic obstructive pulmonary disease, unspecified: Secondary | ICD-10-CM | POA: Diagnosis not present

## 2022-05-18 DIAGNOSIS — E039 Hypothyroidism, unspecified: Secondary | ICD-10-CM | POA: Diagnosis not present

## 2022-05-18 MED ORDER — BUDESONIDE-FORMOTEROL FUMARATE 80-4.5 MCG/ACT IN AERO: 2.0000 | INHALATION_SPRAY | Freq: Two times a day (BID) | RESPIRATORY_TRACT | 3 refills | Status: DC

## 2022-05-18 MED ORDER — ALBUTEROL SULFATE HFA 108 (90 BASE) MCG/ACT IN AERS: INHALATION_SPRAY | RESPIRATORY_TRACT | 3 refills | Status: AC

## 2022-05-18 NOTE — Patient Instructions (Signed)
1. Elevated liver function tests  - Comprehensive metabolic panel - CBC  2. Acquired hypothyroidism  - Comprehensive metabolic panel - CBC - TSH  3. Colon cancer screening  - Cologuard   Follow up:  Follow up in 3 months

## 2022-05-18 NOTE — Progress Notes (Signed)
@Patient  ID: , male    DOB: 01-08-1961, 61 y.o.   MRN: [id number]  Chief Complaint  Patient presents with   Hyperlipidemia   Hypothyroidism    Referring provider: [id number], NP   HPI  Javier Bridges presents for follow up. He  has a past medical history of Bradycardia, Chronic back pain, Hepatitis C, chronic (HCC), Hypothyroidism, and Tobacco dependence.      Patient presents today for a follow-up visit.  Patient has been compliant with medications since last visit. He has no issues or concerns today. Does need repeat labs. Denies f/c/s, n/v/d, hemoptysis, PND, leg swelling Denies chest pain or edema    Allergies  Allergen Reactions   Tricor [Fenofibrate]     Chest pain    Immunization History  Administered Date(s) Administered   Hepatitis A, Adult 12/15/2016   Pneumococcal Polysaccharide-23 05/03/2015   Tdap 09/30/2016    Past Medical History:  Diagnosis Date   Bradycardia    Chronic back pain    Hepatitis C, chronic (HCC)    tx with harvoni   Hypothyroidism    Tobacco dependence     Tobacco History: Social History   Tobacco Use  Smoking Status Every Day   Packs/day: 0.25   Types: Cigarettes  Smokeless Tobacco Never   Ready to quit: Not Answered Counseling given: Not Answered   Outpatient Encounter Medications as of 05/18/2022  Medication Sig   gabapentin (NEURONTIN) 600 MG tablet Take 1 tablet (600 mg total) by mouth 3 (three) times daily.   levothyroxine (SYNTHROID) 50 MCG tablet TAKE 1 TABLET(50 MCG) BY MOUTH DAILY BEFORE BREAKFAST   [DISCONTINUED] albuterol (PROAIR HFA) 108 (90 Base) MCG/ACT inhaler INHALE 2 PUFFS INTO THE LUNGS EVERY 6 HOURS AS NEEDED FOR WHEEZING OR SHORTNESS OF BREATH   [DISCONTINUED] budesonide-formoterol (SYMBICORT) 80-4.5 MCG/ACT inhaler Inhale 2 puffs into the lungs 2 (two) times daily.   albuterol (PROAIR HFA) 108 (90 Base) MCG/ACT inhaler INHALE 2 PUFFS INTO THE LUNGS EVERY 6 HOURS AS NEEDED FOR  WHEEZING OR SHORTNESS OF BREATH   budesonide-formoterol (SYMBICORT) 80-4.5 MCG/ACT inhaler Inhale 2 puffs into the lungs 2 (two) times daily.   rosuvastatin (CRESTOR) 20 MG tablet Take 1 tablet (20 mg total) by mouth daily.   No facility-administered encounter medications on file as of 05/18/2022.     Review of Systems  Review of Systems  Constitutional: Negative.   HENT: Negative.    Cardiovascular: Negative.   Gastrointestinal: Negative.   Allergic/Immunologic: Negative.   Neurological: Negative.   Psychiatric/Behavioral: Negative.         Physical Exam  BP 104/66   Pulse (!) 54   Temp 97.6 F (36.4 C)   Ht 5\' 7"  (1.702 m)   Wt 158 lb (71.7 kg)   SpO2 95%   BMI 24.75 kg/m   Wt Readings from Last 5 Encounters:  05/18/22 158 lb (71.7 kg)  08/25/21 162 lb 3.2 oz (73.6 kg)  07/15/20 160 lb (72.6 kg)  02/29/20 162 lb (73.5 kg)  11/29/19 159 lb 9.6 oz (72.4 kg)     Physical Exam Vitals and nursing note reviewed.  Constitutional:      General: He is not in acute distress.    Appearance: He is well-developed.  Cardiovascular:     Rate and Rhythm: Normal rate and regular rhythm.  Pulmonary:     Effort: Pulmonary effort is normal.     Breath sounds: Normal breath sounds.  Skin:  General: Skin is warm and dry.  Neurological:     Mental Status: He is alert and oriented to person, place, and time.      Lab Results:  CBC    Component Value Date/Time   WBC 8.2 02/23/2022 1028   WBC 13.8 (H) 09/14/2017 0432   RBC 5.04 02/23/2022 1028   RBC 3.77 (L) 09/14/2017 0432   HGB 15.6 02/23/2022 1028   HCT 46.4 02/23/2022 1028   PLT 259 02/23/2022 1028   MCV 92 02/23/2022 1028   MCH 31.0 02/23/2022 1028   MCH 29.2 09/14/2017 0432   MCHC 33.6 02/23/2022 1028   MCHC 33.4 09/14/2017 0432   RDW 14.4 02/23/2022 1028   LYMPHSABS 3.0 09/01/2019 1059   MONOABS 770 09/30/2016 1154   EOSABS 0.7 (H) 09/01/2019 1059   BASOSABS 0.1 09/01/2019 1059    BMET     Component Value Date/Time   NA 136 02/23/2022 1028   K 4.1 02/23/2022 1028   CL 97 02/23/2022 1028   CO2 24 02/23/2022 1028   GLUCOSE 89 02/23/2022 1028   GLUCOSE 133 (H) 09/14/2017 0432   BUN 12 02/23/2022 1028   CREATININE 1.47 (H) 02/23/2022 1028   CREATININE 0.98 02/01/2017 0917   CALCIUM 9.7 02/23/2022 1028   GFRNONAA 59 (L) 07/15/2020 1209   GFRNONAA 85 02/01/2017 0917   GFRAA 68 07/15/2020 1209   GFRAA >89 02/01/2017 0917      Assessment & Plan:   Elevated liver function tests - Comprehensive metabolic panel - CBC  2. Acquired hypothyroidism  - Comprehensive metabolic panel - CBC - TSH  3. Colon cancer screening  - Cologuard   Follow up:  Follow up in 3 months     Javier Andrew, NP 05/18/2022

## 2022-05-18 NOTE — Assessment & Plan Note (Signed)
-   Comprehensive metabolic panel - CBC  2. Acquired hypothyroidism  - Comprehensive metabolic panel - CBC - TSH  3. Colon cancer screening  - Cologuard   Follow up:  Follow up in 3 months

## 2022-05-19 LAB — COMPREHENSIVE METABOLIC PANEL
ALT: 15 IU/L (ref 0–44)
AST: 20 IU/L (ref 0–40)
Albumin/Globulin Ratio: 1.3 (ref 1.2–2.2)
Albumin: 4.1 g/dL (ref 3.9–4.9)
Alkaline Phosphatase: 88 IU/L (ref 44–121)
BUN/Creatinine Ratio: 11 (ref 10–24)
BUN: 14 mg/dL (ref 8–27)
Bilirubin Total: 0.2 mg/dL (ref 0.0–1.2)
CO2: 22 mmol/L (ref 20–29)
Calcium: 9.5 mg/dL (ref 8.6–10.2)
Chloride: 101 mmol/L (ref 96–106)
Creatinine, Ser: 1.26 mg/dL (ref 0.76–1.27)
Globulin, Total: 3.2 g/dL (ref 1.5–4.5)
Glucose: 95 mg/dL (ref 70–99)
Potassium: 4.3 mmol/L (ref 3.5–5.2)
Sodium: 137 mmol/L (ref 134–144)
Total Protein: 7.3 g/dL (ref 6.0–8.5)
eGFR: 64 mL/min/{1.73_m2} (ref >59)

## 2022-05-19 LAB — CBC
Hematocrit: 43.1 % (ref 37.5–51.0)
Hemoglobin: 14.2 g/dL (ref 13.0–17.7)
MCH: 30.5 pg (ref 26.6–33.0)
MCHC: 32.9 g/dL (ref 31.5–35.7)
MCV: 93 fL (ref 79–97)
Platelets: 315 10*3/uL (ref 150–450)
RBC: 4.66 x10E6/uL (ref 4.14–5.80)
RDW: 12.9 % (ref 11.6–15.4)
WBC: 8.8 10*3/uL (ref 3.4–10.8)

## 2022-05-19 LAB — TSH: TSH: 50 u[IU]/mL — ABNORMAL HIGH (ref 0.450–4.500)

## 2022-05-21 ENCOUNTER — Other Ambulatory Visit: Payer: Self-pay | Admitting: Nurse Practitioner

## 2022-05-21 DIAGNOSIS — E039 Hypothyroidism, unspecified: Secondary | ICD-10-CM

## 2022-05-21 MED ORDER — LEVOTHYROXINE SODIUM 75 MCG PO TABS: 75.0000 ug | ORAL_TABLET | Freq: Every day | ORAL | 11 refills | Status: DC

## 2022-05-26 ENCOUNTER — Encounter: Payer: Self-pay | Admitting: *Deleted

## 2022-06-11 ENCOUNTER — Other Ambulatory Visit: Payer: Self-pay

## 2022-06-19 ENCOUNTER — Other Ambulatory Visit: Payer: Self-pay

## 2022-06-25 LAB — COLOGUARD

## 2022-08-19 ENCOUNTER — Ambulatory Visit: Payer: Self-pay | Admitting: Nurse Practitioner

## 2022-08-26 ENCOUNTER — Ambulatory Visit (INDEPENDENT_AMBULATORY_CARE_PROVIDER_SITE_OTHER): Payer: Medicaid Other | Admitting: Nurse Practitioner

## 2022-08-26 ENCOUNTER — Encounter: Payer: Self-pay | Admitting: Nurse Practitioner

## 2022-08-26 VITALS — BP 104/68 | HR 83 | Temp 97.7°F | Ht 68.0 in | Wt 158.2 lb

## 2022-08-26 DIAGNOSIS — E039 Hypothyroidism, unspecified: Secondary | ICD-10-CM | POA: Diagnosis not present

## 2022-08-26 DIAGNOSIS — E781 Pure hyperglyceridemia: Secondary | ICD-10-CM

## 2022-08-26 MED ORDER — ROSUVASTATIN CALCIUM 20 MG PO TABS: 20.0000 mg | ORAL_TABLET | Freq: Every day | ORAL | 0 refills | Status: DC

## 2022-08-26 MED ORDER — LEVOTHYROXINE SODIUM 75 MCG PO TABS: 75.0000 ug | ORAL_TABLET | Freq: Every day | ORAL | 11 refills | Status: DC

## 2022-08-26 NOTE — Patient Instructions (Addendum)
1. Acquired hypothyroidism  - CBC - Comprehensive metabolic panel - Thyroid Panel With TSH - levothyroxine (SYNTHROID) 75 MCG tablet; Take 1 tablet (75 mcg total) by mouth daily.  Dispense: 30 tablet; Refill: 11   2. Hypertriglyceridemia  - rosuvastatin (CRESTOR) 20 MG tablet; Take 1 tablet (20 mg total) by mouth daily.  Dispense: 90 tablet; Refill: 0   Follow up:  Follow up in 3 months

## 2022-08-26 NOTE — Assessment & Plan Note (Signed)
-   CBC - Comprehensive metabolic panel - Thyroid Panel With TSH - levothyroxine (SYNTHROID) 75 MCG tablet; Take 1 tablet (75 mcg total) by mouth daily.  Dispense: 30 tablet; Refill: 11   2. Hypertriglyceridemia  - rosuvastatin (CRESTOR) 20 MG tablet; Take 1 tablet (20 mg total) by mouth daily.  Dispense: 90 tablet; Refill: 0   Follow up:  Follow up in 3 months

## 2022-08-26 NOTE — Progress Notes (Signed)
$@Patientm$  ID: Javier Bridges, male    DOB: 06-Oct-1960, 62 y.o.   MRN: [id number]  Chief Complaint  Patient presents with   Follow-up    Referring provider: Fenton Foy, NP   HPI   Javier Bridges presents for follow up. He  has a past medical history of Bradycardia, Chronic back pain, Hepatitis C, chronic ([place]), Hypothyroidism, and Tobacco dependence.     Patient presents for a follow-up on hypothyroidism.  He states that he is feeling much better since his last dose change of Synthroid.  We will recheck labs today.  Overall patient has been doing well since last visit.  He has no new issues or concerns today. Denies f/c/s, n/v/d, hemoptysis, PND, leg swelling Denies chest pain or edema     Allergies  Allergen Reactions   Tricor [Fenofibrate]     Chest pain    Immunization History  Administered Date(s) Administered   Hepatitis A, Adult 12/15/2016   Pneumococcal Polysaccharide-23 05/03/2015   Tdap 09/30/2016    Past Medical History:  Diagnosis Date   Bradycardia    Chronic back pain    Hepatitis C, chronic (HCC)    tx with harvoni   Hypothyroidism    Tobacco dependence     Tobacco History: Social History   Tobacco Use  Smoking Status Every Day   Packs/day: 0.25   Types: Cigarettes  Smokeless Tobacco Never   Ready to quit: Not Answered Counseling given: Not Answered   Outpatient Encounter Medications as of 08/26/2022  Medication Sig   albuterol (PROAIR HFA) 108 (90 Base) MCG/ACT inhaler INHALE 2 PUFFS INTO THE LUNGS EVERY 6 HOURS AS NEEDED FOR WHEEZING OR SHORTNESS OF BREATH   budesonide-formoterol (SYMBICORT) 80-4.5 MCG/ACT inhaler Inhale 2 puffs into the lungs 2 (two) times daily.   gabapentin (NEURONTIN) 600 MG tablet Take 1 tablet (600 mg total) by mouth 3 (three) times daily.   [DISCONTINUED] levothyroxine (SYNTHROID) 75 MCG tablet Take 1 tablet (75 mcg total) by mouth daily.   [DISCONTINUED] rosuvastatin (CRESTOR) 20 MG tablet Take 1 tablet (20  mg total) by mouth daily.   levothyroxine (SYNTHROID) 75 MCG tablet Take 1 tablet (75 mcg total) by mouth daily.   rosuvastatin (CRESTOR) 20 MG tablet Take 1 tablet (20 mg total) by mouth daily.   No facility-administered encounter medications on file as of 08/26/2022.     Review of Systems  Review of Systems  Constitutional: Negative.   HENT: Negative.    Cardiovascular: Negative.   Gastrointestinal: Negative.   Allergic/Immunologic: Negative.   Neurological: Negative.   Psychiatric/Behavioral: Negative.         Physical Exam  BP 104/68   Pulse 83   Temp 97.7 F (36.5 C)   Ht 5' 8"$  (1.727 m)   Wt 158 lb 3.2 oz (71.8 kg)   SpO2 96%   BMI 24.05 kg/m   Wt Readings from Last 5 Encounters:  08/26/22 158 lb 3.2 oz (71.8 kg)  05/18/22 158 lb (71.7 kg)  08/25/21 162 lb 3.2 oz (73.6 kg)  07/15/20 160 lb (72.6 kg)  02/29/20 162 lb (73.5 kg)     Physical Exam Vitals and nursing note reviewed.  Constitutional:      General: He is not in acute distress.    Appearance: He is well-developed.  Cardiovascular:     Rate and Rhythm: Normal rate and regular rhythm.  Pulmonary:     Effort: Pulmonary effort is normal.     Breath sounds: Normal  breath sounds.  Skin:    General: Skin is warm and dry.  Neurological:     Mental Status: He is alert and oriented to person, place, and time.      Lab Results:  CBC    Component Value Date/Time   WBC 8.8 05/18/2022 1051   WBC 13.8 (H) 09/14/2017 0432   RBC 4.66 05/18/2022 1051   RBC 3.77 (L) 09/14/2017 0432   HGB 14.2 05/18/2022 1051   HCT 43.1 05/18/2022 1051   PLT 315 05/18/2022 1051   MCV 93 05/18/2022 1051   MCH 30.5 05/18/2022 1051   MCH 29.2 09/14/2017 0432   MCHC 32.9 05/18/2022 1051   MCHC 33.4 09/14/2017 0432   RDW 12.9 05/18/2022 1051   LYMPHSABS 3.0 09/01/2019 1059   MONOABS 770 09/30/2016 1154   EOSABS 0.7 (H) 09/01/2019 1059   BASOSABS 0.1 09/01/2019 1059    BMET    Component Value Date/Time   NA  137 05/18/2022 1051   K 4.3 05/18/2022 1051   CL 101 05/18/2022 1051   CO2 22 05/18/2022 1051   GLUCOSE 95 05/18/2022 1051   GLUCOSE 133 (H) 09/14/2017 0432   BUN 14 05/18/2022 1051   CREATININE 1.26 05/18/2022 1051   CREATININE 0.98 02/01/2017 0917   CALCIUM 9.5 05/18/2022 1051   GFRNONAA 59 (L) 07/15/2020 1209   GFRNONAA 85 02/01/2017 0917   GFRAA 68 07/15/2020 1209   GFRAA >89 02/01/2017 0917      Assessment & Plan:   Acquired hypothyroidism - CBC - Comprehensive metabolic panel - Thyroid Panel With TSH - levothyroxine (SYNTHROID) 75 MCG tablet; Take 1 tablet (75 mcg total) by mouth daily.  Dispense: 30 tablet; Refill: 11   2. Hypertriglyceridemia  - rosuvastatin (CRESTOR) 20 MG tablet; Take 1 tablet (20 mg total) by mouth daily.  Dispense: 90 tablet; Refill: 0   Follow up:  Follow up in 3 months     Fenton Foy, NP 08/26/2022

## 2022-08-27 ENCOUNTER — Other Ambulatory Visit: Payer: Self-pay | Admitting: Nurse Practitioner

## 2022-08-27 LAB — COMPREHENSIVE METABOLIC PANEL
ALT: 23 IU/L (ref 0–44)
AST: 22 IU/L (ref 0–40)
Albumin/Globulin Ratio: 1.5 (ref 1.2–2.2)
Albumin: 4.5 g/dL (ref 3.9–4.9)
Alkaline Phosphatase: 91 IU/L (ref 44–121)
BUN/Creatinine Ratio: 14 (ref 10–24)
BUN: 15 mg/dL (ref 8–27)
Bilirubin Total: 0.3 mg/dL (ref 0.0–1.2)
CO2: 21 mmol/L (ref 20–29)
Calcium: 9.6 mg/dL (ref 8.6–10.2)
Chloride: 103 mmol/L (ref 96–106)
Creatinine, Ser: 1.1 mg/dL (ref 0.76–1.27)
Globulin, Total: 3 g/dL (ref 1.5–4.5)
Glucose: 94 mg/dL (ref 70–99)
Potassium: 4.2 mmol/L (ref 3.5–5.2)
Sodium: 140 mmol/L (ref 134–144)
Total Protein: 7.5 g/dL (ref 6.0–8.5)
eGFR: 75 mL/min/{1.73_m2} (ref >59)

## 2022-08-27 LAB — CBC
Hematocrit: 47.2 % (ref 37.5–51.0)
Hemoglobin: 16.2 g/dL (ref 13.0–17.7)
MCH: 30.3 pg (ref 26.6–33.0)
MCHC: 34.3 g/dL (ref 31.5–35.7)
MCV: 88 fL (ref 79–97)
Platelets: 307 10*3/uL (ref 150–450)
RBC: 5.35 x10E6/uL (ref 4.14–5.80)
RDW: 13.3 % (ref 11.6–15.4)
WBC: 8.4 10*3/uL (ref 3.4–10.8)

## 2022-08-27 LAB — THYROID PANEL WITH TSH
Free Thyroxine Index: 2.2 (ref 1.2–4.9)
T3 Uptake Ratio: 28 % (ref 24–39)
T4, Total: 7.9 ug/dL (ref 4.5–12.0)
TSH: 21.4 u[IU]/mL — ABNORMAL HIGH (ref 0.450–4.500)

## 2022-08-27 MED ORDER — LEVOTHYROXINE SODIUM 100 MCG PO TABS: 100.0000 ug | ORAL_TABLET | Freq: Every day | ORAL | 11 refills | Status: DC

## 2022-09-09 ENCOUNTER — Other Ambulatory Visit: Payer: Self-pay | Admitting: Nurse Practitioner

## 2022-09-09 NOTE — Telephone Encounter (Signed)
Please advise Javier Bridges

## 2022-11-25 ENCOUNTER — Ambulatory Visit: Payer: Medicaid Other | Admitting: Nurse Practitioner

## 2023-03-11 ENCOUNTER — Other Ambulatory Visit: Payer: Self-pay

## 2023-03-11 DIAGNOSIS — J449 Chronic obstructive pulmonary disease, unspecified: Secondary | ICD-10-CM

## 2023-03-11 MED ORDER — GABAPENTIN 600 MG PO TABS: 600.0000 mg | ORAL_TABLET | Freq: Three times a day (TID) | ORAL | 0 refills | Status: DC

## 2023-03-11 MED ORDER — BUDESONIDE-FORMOTEROL FUMARATE 80-4.5 MCG/ACT IN AERO: 2.0000 | INHALATION_SPRAY | Freq: Two times a day (BID) | RESPIRATORY_TRACT | 3 refills | Status: DC

## 2023-03-11 NOTE — Telephone Encounter (Signed)
Please advise KH 

## 2023-04-09 ENCOUNTER — Other Ambulatory Visit: Payer: Self-pay | Admitting: Nurse Practitioner

## 2023-04-09 DIAGNOSIS — Z1212 Encounter for screening for malignant neoplasm of rectum: Secondary | ICD-10-CM

## 2023-04-09 DIAGNOSIS — Z1211 Encounter for screening for malignant neoplasm of colon: Secondary | ICD-10-CM

## 2023-05-19 ENCOUNTER — Other Ambulatory Visit: Payer: Self-pay

## 2023-05-19 DIAGNOSIS — E781 Pure hyperglyceridemia: Secondary | ICD-10-CM

## 2023-05-19 MED ORDER — ROSUVASTATIN CALCIUM 20 MG PO TABS: 20.0000 mg | ORAL_TABLET | Freq: Every day | ORAL | 0 refills | Status: DC

## 2023-06-18 ENCOUNTER — Ambulatory Visit: Payer: Self-pay | Admitting: Nurse Practitioner

## 2023-06-24 ENCOUNTER — Encounter: Payer: Self-pay | Admitting: Nurse Practitioner

## 2023-06-24 ENCOUNTER — Ambulatory Visit (INDEPENDENT_AMBULATORY_CARE_PROVIDER_SITE_OTHER): Payer: Medicaid Other | Admitting: Nurse Practitioner

## 2023-06-24 VITALS — BP 126/73 | HR 61 | Temp 97.5°F | Wt 151.8 lb

## 2023-06-24 DIAGNOSIS — E039 Hypothyroidism, unspecified: Secondary | ICD-10-CM | POA: Diagnosis not present

## 2023-06-24 DIAGNOSIS — E781 Pure hyperglyceridemia: Secondary | ICD-10-CM | POA: Diagnosis not present

## 2023-06-24 DIAGNOSIS — K219 Gastro-esophageal reflux disease without esophagitis: Secondary | ICD-10-CM

## 2023-06-24 MED ORDER — OMEPRAZOLE 20 MG PO CPDR
20.0000 mg | DELAYED_RELEASE_CAPSULE | Freq: Every day | ORAL | 3 refills | Status: DC
Start: 2023-06-24 — End: 2024-01-13

## 2023-06-24 MED ORDER — LEVOTHYROXINE SODIUM 100 MCG PO TABS: 100.0000 ug | ORAL_TABLET | Freq: Every day | ORAL | 11 refills | Status: DC

## 2023-06-24 MED ORDER — ROSUVASTATIN CALCIUM 20 MG PO TABS: 20.0000 mg | ORAL_TABLET | Freq: Every day | ORAL | 0 refills | Status: DC

## 2023-06-24 NOTE — Patient Instructions (Addendum)
1. Hypertriglyceridemia  - rosuvastatin (CRESTOR) 20 MG tablet; Take 1 tablet (20 mg total) by mouth daily.  Dispense: 90 tablet; Refill: 0    2. Gastroesophageal reflux disease without esophagitis (Primary)  - omeprazole (PRILOSEC) 20 MG capsule; Take 1 capsule (20 mg total) by mouth daily.  Dispense: 30 capsule; Refill: 3    Follow up:  Follow up in 3 months

## 2023-06-24 NOTE — Progress Notes (Signed)
Subjective   Patient ID: Javier Bridges, male    DOB: Mar 06, 1961, 62 y.o.   MRN: [id number]  Chief Complaint  Patient presents with   Follow-up    Girlfriend passed away last month    Referring provider: Ivonne Andrew, NP  [name] is a 62 y.o. male with Past Medical History: No date: Bradycardia No date: Chronic back pain No date: Hepatitis C, chronic (HCC)     Comment:  tx with harvoni No date: Hypothyroidism No date: Tobacco dependence   HPI  Patient presents for a follow-up on hypothyroidism.  He states that he is feeling much better since his last dose change of Synthroid.  We will recheck labs today.  Overall patient has been doing well since last visit. He is having some depression related to the passing of his partner (girlfriend). He declines counseling at this time.  He has no new issues or concerns today. Denies f/c/s, n/v/d, hemoptysis, PND, leg swelling Denies chest pain or edema.     Allergies  Allergen Reactions   Tricor [Fenofibrate]     Chest pain    Immunization History  Administered Date(s) Administered   Hepatitis A, Adult 12/15/2016   Pneumococcal Polysaccharide-23 05/03/2015   Tdap 09/30/2016    Tobacco History: Social History   Tobacco Use  Smoking Status Every Day   Current packs/day: 0.25   Types: Cigarettes  Smokeless Tobacco Never   Ready to quit: Yes Counseling given: Yes   Outpatient Encounter Medications as of 06/24/2023  Medication Sig   albuterol (PROAIR HFA) 108 (90 Base) MCG/ACT inhaler INHALE 2 PUFFS INTO THE LUNGS EVERY 6 HOURS AS NEEDED FOR WHEEZING OR SHORTNESS OF BREATH   budesonide-formoterol (SYMBICORT) 80-4.5 MCG/ACT inhaler Inhale 2 puffs into the lungs 2 (two) times daily.   gabapentin (NEURONTIN) 600 MG tablet Take 1 tablet (600 mg total) by mouth 3 (three) times daily.   omeprazole (PRILOSEC) 20 MG capsule Take 1 capsule (20 mg total) by mouth daily.   [DISCONTINUED] levothyroxine (SYNTHROID) 100 MCG  tablet Take 1 tablet (100 mcg total) by mouth daily.   [DISCONTINUED] rosuvastatin (CRESTOR) 20 MG tablet Take 1 tablet (20 mg total) by mouth daily.   levothyroxine (SYNTHROID) 100 MCG tablet Take 1 tablet (100 mcg total) by mouth daily.   rosuvastatin (CRESTOR) 20 MG tablet Take 1 tablet (20 mg total) by mouth daily.   No facility-administered encounter medications on file as of 06/24/2023.    Review of Systems  Review of Systems  Constitutional: Negative.   HENT: Negative.    Cardiovascular: Negative.   Gastrointestinal: Negative.   Allergic/Immunologic: Negative.   Neurological: Negative.   Psychiatric/Behavioral: Negative.       Objective:   BP 126/73   Pulse 61   Temp (!) 97.5 F (36.4 C)   Wt 151 lb 12.8 oz (68.9 kg)   SpO2 98%   BMI 23.08 kg/m   Wt Readings from Last 5 Encounters:  06/24/23 151 lb 12.8 oz (68.9 kg)  08/26/22 158 lb 3.2 oz (71.8 kg)  05/18/22 158 lb (71.7 kg)  08/25/21 162 lb 3.2 oz (73.6 kg)  07/15/20 160 lb (72.6 kg)     Physical Exam Vitals and nursing note reviewed.  Constitutional:      General: He is not in acute distress.    Appearance: He is well-developed.  Cardiovascular:     Rate and Rhythm: Normal rate and regular rhythm.  Pulmonary:     Effort: Pulmonary effort  is normal.     Breath sounds: Normal breath sounds.  Skin:    General: Skin is warm and dry.  Neurological:     Mental Status: He is alert and oriented to person, place, and time.       Assessment & Plan:   Gastroesophageal reflux disease without esophagitis -     Omeprazole; Take 1 capsule (20 mg total) by mouth daily.  Dispense: 30 capsule; Refill: 3  Hypertriglyceridemia -     Rosuvastatin Calcium; Take 1 tablet (20 mg total) by mouth daily.  Dispense: 90 tablet; Refill: 0 -     CBC -     Comprehensive metabolic panel -     Lipid panel  Acquired hypothyroidism -     Thyroid Panel With TSH  Other orders -     Levothyroxine Sodium; Take 1 tablet  (100 mcg total) by mouth daily.  Dispense: 30 tablet; Refill: 11     Return in about 6 months (around 12/23/2023).   Ivonne Andrew, NP 06/24/2023

## 2023-06-25 ENCOUNTER — Other Ambulatory Visit: Payer: Self-pay | Admitting: Nurse Practitioner

## 2023-06-25 ENCOUNTER — Telehealth: Payer: Self-pay

## 2023-06-25 DIAGNOSIS — E039 Hypothyroidism, unspecified: Secondary | ICD-10-CM

## 2023-06-25 LAB — COMPREHENSIVE METABOLIC PANEL
ALT: 12 [IU]/L (ref 0–44)
AST: 18 [IU]/L (ref 0–40)
Albumin: 4.2 g/dL (ref 3.9–4.9)
Alkaline Phosphatase: 101 [IU]/L (ref 44–121)
BUN/Creatinine Ratio: 13 (ref 10–24)
BUN: 12 mg/dL (ref 8–27)
Bilirubin Total: <0.2 mg/dL (ref 0.0–1.2)
CO2: 22 mmol/L (ref 20–29)
Calcium: 9.4 mg/dL (ref 8.6–10.2)
Chloride: 101 mmol/L (ref 96–106)
Creatinine, Ser: 0.93 mg/dL (ref 0.76–1.27)
Globulin, Total: 3 g/dL (ref 1.5–4.5)
Glucose: 113 mg/dL — ABNORMAL HIGH (ref 70–99)
Potassium: 4.4 mmol/L (ref 3.5–5.2)
Sodium: 136 mmol/L (ref 134–144)
Total Protein: 7.2 g/dL (ref 6.0–8.5)
eGFR: 92 mL/min/{1.73_m2} (ref >59)

## 2023-06-25 LAB — CBC
Hematocrit: 43.9 % (ref 37.5–51.0)
Hemoglobin: 14.4 g/dL (ref 13.0–17.7)
MCH: 30.1 pg (ref 26.6–33.0)
MCHC: 32.8 g/dL (ref 31.5–35.7)
MCV: 92 fL (ref 79–97)
Platelets: 367 10*3/uL (ref 150–450)
RBC: 4.78 x10E6/uL (ref 4.14–5.80)
RDW: 13.9 % (ref 11.6–15.4)
WBC: 10.8 10*3/uL (ref 3.4–10.8)

## 2023-06-25 LAB — THYROID PANEL WITH TSH
Free Thyroxine Index: 0.8 — ABNORMAL LOW (ref 1.2–4.9)
T3 Uptake Ratio: 21 % — ABNORMAL LOW (ref 24–39)
T4, Total: 3.6 ug/dL — ABNORMAL LOW (ref 4.5–12.0)
TSH: 63 u[IU]/mL — ABNORMAL HIGH (ref 0.450–4.500)

## 2023-06-25 LAB — LIPID PANEL
Chol/HDL Ratio: 5.7 {ratio} — ABNORMAL HIGH (ref 0.0–5.0)
Cholesterol, Total: 228 mg/dL — ABNORMAL HIGH (ref 100–199)
HDL: 40 mg/dL (ref >39)
LDL Chol Calc (NIH): 154 mg/dL — ABNORMAL HIGH (ref 0–99)
Triglycerides: 184 mg/dL — ABNORMAL HIGH (ref 0–149)
VLDL Cholesterol Cal: 34 mg/dL (ref 5–40)

## 2023-06-25 MED ORDER — LEVOTHYROXINE SODIUM 125 MCG PO TABS: 125.0000 ug | ORAL_TABLET | Freq: Every day | ORAL | 11 refills | Status: DC

## 2023-06-25 NOTE — Telephone Encounter (Signed)
-----   Message from Ivonne Andrew sent at 06/25/2023  9:02 AM EST ----- Please call to let patient know that his thyroid levels were still elevated.  I have sent in a new prescription for Synthroid.  He needs to stop his previous prescription and start the new dose of Synthroid 125 mcg daily.  Advised patient to take Synthroid once a day every morning before breakfast with only water and wait 30 minutes to 1 hour before eating or drinking anything else.  I have also placed a referral to endocrinology for further evaluation.

## 2023-06-25 NOTE — Telephone Encounter (Signed)
Copied from CRM [id number]. Topic: General - Call Back - No Documentation >> Jun 25, 2023 11:03 AM Carlatta H wrote: Reason for HYQ:MVHQIO call patient back at [phone]//Patient received a call today about medication//

## 2023-08-02 ENCOUNTER — Other Ambulatory Visit: Payer: Self-pay

## 2023-08-02 DIAGNOSIS — E039 Hypothyroidism, unspecified: Secondary | ICD-10-CM

## 2023-08-05 ENCOUNTER — Other Ambulatory Visit: Payer: Self-pay

## 2023-08-05 ENCOUNTER — Other Ambulatory Visit: Payer: Medicaid Other

## 2023-08-05 DIAGNOSIS — E039 Hypothyroidism, unspecified: Secondary | ICD-10-CM

## 2023-08-06 ENCOUNTER — Other Ambulatory Visit: Payer: Medicaid Other

## 2023-08-06 LAB — THYROID PANEL WITH TSH
Free Thyroxine Index: 4.7 (ref 1.2–4.9)
T3 Uptake Ratio: 38 % (ref 24–39)
T4, Total: 12.3 ug/dL — ABNORMAL HIGH (ref 4.5–12.0)
TSH: 1.48 u[IU]/mL (ref 0.450–4.500)

## 2023-08-12 ENCOUNTER — Ambulatory Visit: Payer: Medicare Other | Admitting: "Endocrinology

## 2023-10-05 ENCOUNTER — Other Ambulatory Visit: Payer: Self-pay | Admitting: Nurse Practitioner

## 2023-10-05 DIAGNOSIS — J449 Chronic obstructive pulmonary disease, unspecified: Secondary | ICD-10-CM

## 2023-10-08 ENCOUNTER — Other Ambulatory Visit: Payer: Self-pay | Admitting: Nurse Practitioner

## 2023-10-08 DIAGNOSIS — E781 Pure hyperglyceridemia: Secondary | ICD-10-CM

## 2023-10-08 MED ORDER — ROSUVASTATIN CALCIUM 20 MG PO TABS: 20.0000 mg | ORAL_TABLET | Freq: Every day | ORAL | 0 refills | Status: DC

## 2023-10-08 NOTE — Telephone Encounter (Signed)
 Copied from CRM [id number]. Topic: Clinical - Medication Refill >> Oct 08, 2023 11:39 AM Shelah Lewandowsky wrote: Most Recent Primary Care Visit:  Provider: [facility]  Department: [facility]  Visit Type: LAB VISIT  Date: 08/05/2023  Medication: rosuvastatin (CRESTOR) 20 MG tablet   Has the patient contacted their pharmacy? Yes (Agent: If no, request that the patient contact the pharmacy for the refill. If patient does not wish to contact the pharmacy document the reason why and proceed with request.) (Agent: If yes, when and what did the pharmacy advise?)  Is this the correct pharmacy for this prescription? Yes If no, delete pharmacy and type the correct one.  This is the patient's preferred pharmacy:  [facility]  - [place], [place] - [address] [place] [address] Phone: [phone]     Has the prescription been filled recently? No  Is the patient out of the medication? Yes  Has the patient been seen for an appointment in the last year OR does the patient have an upcoming appointment? Yes  Can we respond through MyChart? No  Agent: Please be advised that Rx refills may take up to 3 business days. We ask that you follow-up with your pharmacy.

## 2023-11-04 ENCOUNTER — Encounter (HOSPITAL_COMMUNITY): Payer: Self-pay | Admitting: Emergency Medicine

## 2023-11-04 ENCOUNTER — Emergency Department (HOSPITAL_COMMUNITY)
Admission: EM | Admit: 2023-11-04 | Discharge: 2023-11-04 | Disposition: A | Discharge status: Home / Self Care | Attending: Emergency Medicine | Admitting: Emergency Medicine

## 2023-11-04 ENCOUNTER — Other Ambulatory Visit: Payer: Self-pay

## 2023-11-04 DIAGNOSIS — Z79899 Other long term (current) drug therapy: Secondary | ICD-10-CM | POA: Insufficient documentation

## 2023-11-04 DIAGNOSIS — J449 Chronic obstructive pulmonary disease, unspecified: Secondary | ICD-10-CM | POA: Diagnosis not present

## 2023-11-04 DIAGNOSIS — I951 Orthostatic hypotension: Secondary | ICD-10-CM | POA: Diagnosis not present

## 2023-11-04 DIAGNOSIS — R55 Syncope and collapse: Secondary | ICD-10-CM | POA: Diagnosis present

## 2023-11-04 DIAGNOSIS — E039 Hypothyroidism, unspecified: Secondary | ICD-10-CM | POA: Diagnosis not present

## 2023-11-04 DIAGNOSIS — Z7951 Long term (current) use of inhaled steroids: Secondary | ICD-10-CM | POA: Insufficient documentation

## 2023-11-04 DIAGNOSIS — Z720 Tobacco use: Secondary | ICD-10-CM | POA: Insufficient documentation

## 2023-11-04 LAB — COMPREHENSIVE METABOLIC PANEL WITH GFR
ALT: 18 U/L (ref 0–44)
AST: 21 U/L (ref 15–41)
Albumin: 3.2 g/dL — ABNORMAL LOW (ref 3.5–5.0)
Alkaline Phosphatase: 72 U/L (ref 38–126)
Anion gap: 8 (ref 5–15)
BUN: 11 mg/dL (ref 8–23)
CO2: 24 mmol/L (ref 22–32)
Calcium: 9.2 mg/dL (ref 8.9–10.3)
Chloride: 108 mmol/L (ref 98–111)
Creatinine, Ser: 1.2 mg/dL (ref 0.61–1.24)
GFR, Estimated: >60 mL/min (ref >60)
Glucose, Bld: 135 mg/dL — ABNORMAL HIGH (ref 70–99)
Potassium: 4 mmol/L (ref 3.5–5.1)
Sodium: 140 mmol/L (ref 135–145)
Total Bilirubin: 0.7 mg/dL (ref 0.0–1.2)
Total Protein: 6.4 g/dL — ABNORMAL LOW (ref 6.5–8.1)

## 2023-11-04 LAB — CBC
HCT: 40.7 % (ref 39.0–52.0)
Hemoglobin: 13.1 g/dL (ref 13.0–17.0)
MCH: 29.3 pg (ref 26.0–34.0)
MCHC: 32.2 g/dL (ref 30.0–36.0)
MCV: 91.1 fL (ref 80.0–100.0)
Platelets: 379 10*3/uL (ref 150–400)
RBC: 4.47 MIL/uL (ref 4.22–5.81)
RDW: 13.9 % (ref 11.5–15.5)
WBC: 12.4 10*3/uL — ABNORMAL HIGH (ref 4.0–10.5)
nRBC: 0 % (ref 0.0–0.2)

## 2023-11-04 LAB — CBG MONITORING, ED: Glucose-Capillary: 129 mg/dL — ABNORMAL HIGH (ref 70–99)

## 2023-11-04 MED ORDER — SODIUM CHLORIDE 0.9 % IV BOLUS
500.0000 mL | Freq: Once | INTRAVENOUS | Status: AC
  Administered 2023-11-04: 500 mL via INTRAVENOUS

## 2023-11-04 NOTE — Discharge Instructions (Addendum)
 Please follow-up with a primary care provider in regards to recent symptoms and ER visit.  Today your labs and exam are reassuring however you do appear to have orthostatic hypotension.  Please drink plenty of fluids and when you go from a lying to a standing position please move slowly.  If symptoms change or worsen please return to the ER.

## 2023-11-04 NOTE — ED Provider Notes (Cosign Needed Addendum)
 [place] EMERGENCY DEPARTMENT AT [facility] Provider Note   CSN: [id number] Arrival date & time: 11/04/23  1149     History  Chief Complaint  Patient presents with   Near Syncope   Hypotension    Javier Bridges is a 63 y.o. male history of bradycardia, hep C, tobacco dependence, hypothyroidism, COPD presented for lightheadedness that lasted for few moments.  Patient notes he was in the kitchen drinking water when he felt the feeling of warmth at home and felt lightheaded.  Patient states he had these exact episodes since he was 64 years old and that it ultimately resolved.  Patient denies any shortness of breath, chest pain, weakness, falls, recent illnesses, dysuria, abdominal pain.  Patient denies any medicinal changes.  Home Medications Prior to Admission medications   Medication Sig Start Date End Date Taking? Authorizing Provider  albuterol  (PROAIR  HFA) 108 (90 Base) MCG/ACT inhaler INHALE 2 PUFFS INTO THE LUNGS EVERY 6 HOURS AS NEEDED FOR WHEEZING OR SHORTNESS OF BREATH 05/18/22   Jerrlyn Morel, NP  budesonide -formoterol  (SYMBICORT ) 80-4.5 MCG/ACT inhaler INHALE 2 PUFFS INTO THE LUNGS TWICE DAILY 10/05/23   Nichols, Tonya S, NP  gabapentin  (NEURONTIN ) 600 MG tablet Take 1 tablet (600 mg total) by mouth 3 (three) times daily. 03/11/23   Jerrlyn Morel, NP  levothyroxine  (SYNTHROID ) 125 MCG tablet Take 1 tablet (125 mcg total) by mouth daily. 06/25/23 06/24/24  Jerrlyn Morel, NP  omeprazole  (PRILOSEC) 20 MG capsule Take 1 capsule (20 mg total) by mouth daily. 06/24/23   Jerrlyn Morel, NP  rosuvastatin  (CRESTOR ) 20 MG tablet Take 1 tablet (20 mg total) by mouth daily. 10/08/23 10/07/24  Jerrlyn Morel, NP      Allergies    Tricor  [email or web address] ]    Review of Systems   Review of Systems  Physical Exam Updated Vital Signs BP 97/66 (BP Location: Right Arm)   Pulse 81   Temp 97.7 F (36.5 C) (Oral)   Resp 16   Ht 5\' 8"  (1.727 m)   Wt 68 kg   SpO2 94%   BMI  22.81 kg/m  Physical Exam Vitals reviewed.  Constitutional:      General: He is not in acute distress. HENT:     Head: Normocephalic and atraumatic.  Eyes:     Extraocular Movements: Extraocular movements intact.     Conjunctiva/sclera: Conjunctivae normal.     Pupils: Pupils are equal, round, and reactive to light.  Cardiovascular:     Rate and Rhythm: Normal rate and regular rhythm.     Pulses: Normal pulses.     Heart sounds: Normal heart sounds.     Comments: 2+ bilateral radial/dorsalis pedis pulses with regular rate Pulmonary:     Effort: Pulmonary effort is normal. No respiratory distress.     Breath sounds: Normal breath sounds.  Abdominal:     Palpations: Abdomen is soft.     Tenderness: There is no abdominal tenderness. There is no guarding or rebound.  Musculoskeletal:        General: Normal range of motion.     Cervical back: Normal range of motion and neck supple.     Comments: 5 out of 5 bilateral grip/leg extension strength  Skin:    General: Skin is warm and dry.     Capillary Refill: Capillary refill takes less than 2 seconds.  Neurological:     General: No focal deficit present.     Mental Status: He is alert  and oriented to person, place, and time.     Sensory: Sensation is intact.     Motor: Motor function is intact.     Coordination: Coordination is intact.     Gait: Gait is intact.     Comments: Sensation intact in all 4 limbs Cranial nerves III to XII intact Vision grossly intact  Psychiatric:        Mood and Affect: Mood normal.    ED Results / Procedures / Treatments   Labs (all labs ordered are listed, but only abnormal results are displayed) Labs Reviewed  CBG MONITORING, ED - Abnormal; Notable for the following components:      Result Value   Glucose-Capillary 129 (*)    All other components within normal limits  COMPREHENSIVE METABOLIC PANEL WITH GFR  CBC  URINALYSIS, ROUTINE W REFLEX MICROSCOPIC    EKG None  Radiology No  results found.  Procedures Procedures    Medications Ordered in ED Medications - No data to display  ED Course/ Medical Decision Making/ A&P                                 Medical Decision Making Amount and/or Complexity of Data Reviewed Labs: ordered.   Javier Bridges 63 y.o. presented today for lightheadedness. Working DDx that I considered at this time includes, but not limited to, orthostatic, CVA/TIA, arrhythmia, CNS lesion, BPPV, vestibular labyrinthitis, Meniere's, Ramsey-Hunt, polypharmacy, orthostatic hypotension, electrolyte abnormalities.  R/o DDx: CVA/TIA, arrhythmia, CNS lesion, BPPV, vestibular labyrinthitis, Meniere's, Ramsey-Hunt, polypharmacy, orthostatic hypotension, electrolyte abnormalities: These are considered less likely due to history of present illness, physical exam, lab/imaging findings  Review of prior external notes: 06/24/2023 office visit  Unique Tests and My Independent Interpretation:  EKG: Sinus 78 bpm, no signs of right heart strain or ischemia CBC: Mild leukocytosis 12.4 CMP: Unremarkable CBG: 129  Social Determinants of Health: none  Discussion with Independent Historian: None  Discussion of Management of Tests: None  Risk: Medium: prescription drug management  Risk Stratification Score: none  Plan: On exam patient was no acute distress. Physical exam showed no acute findings. The cardiac monitor was ordered secondary to the patient's history of dizziness and to monitor the patient for dysrhythmia. Cardiac monitor by my independent interpretation showed normal sinus.  Patient's exam is ultimately reassuring.  Patient states that he got lightheaded with prodromal symptoms suspicious of orthostatic hypotension.  Orthostatics were performed and patient was orthostatic positive.  Will give small mild fluids as patient is already gone 500 of fluids with EMS but do feel this is the cause of patient's lightheadedness.  After fluids and  drinking 3 cups of water patient states he feels much better and would like to be discharged.  Patient was ambulated and is able to ambulate without difficulty and while to go reevaluate him he was eating [employer] with his family member and states that his family member will drive him home and that he would like to be discharged.  At this time do feel patient is safe to be discharged.  Patient does not feel he needs to give a urine sample at this time I do feel this is reasonable as he has no urinary complaints and we have an alternative diagnosis.  Patient was given return precautions. Patient stable for discharge at this time.  Patient verbalized understanding of plan.  This chart was dictated using voice recognition software.  Despite best efforts  to proofread,  errors can occur which can change the documentation meaning.        Final Clinical Impression(s) / ED Diagnoses Final diagnoses:  None    Rx / DC Orders ED Discharge Orders     None         Elex Grimmer 11/04/23 1429    Denese Finn, PA-C 11/04/23 1429    Ninetta Basket, MD 11/09/23 2036

## 2023-11-04 NOTE — ED Triage Notes (Signed)
 Pt arrives via EMS from home where patient was in the kitchen became near syncopal. Unable to stand. EMS called. Initial, bp 50 systolic, felt light headed and dizziness. Pt received 500ml NS bolus PTA. 18g IV LAC. CBG 150, EKG NSR. Current vitals stable. Pt alert, oriented x4, hx of COPD.

## 2023-11-04 NOTE — ED Notes (Addendum)
 BP before ambulating patient in the hallways  - 98/76 Post BP after ambulating patient in the hallway - 93/63   Patient denies any dizziness while walking and after walking.

## 2023-11-09 ENCOUNTER — Telehealth: Payer: Self-pay

## 2023-11-09 NOTE — Transitions of Care (Post Inpatient/ED Visit) (Unsigned)
   11/09/2023  Name: Javier Bridges MRN: [id number] DOB: [birth year]  Today's TOC FU Call Status:   Patient's Name and Date of Birth confirmed.  Transition Care Management Follow-up Telephone Call Date of Discharge: 11/04/23 Discharge Facility: [facility]) Type of Discharge: Emergency Department Reason for ED Visit: Other: How have you been since you were released from the hospital?: Better Any questions or concerns?: No  Items Reviewed: Did you receive and understand the discharge instructions provided?: No Medications obtained,verified, and reconciled?: No Any new allergies since your discharge?: No Dietary orders reviewed?: No Do you have support at home?: Yes People in Home [RPT]: parent(s)  Medications Reviewed Today: Medications Reviewed Today     Reviewed by Angelita Bares, CMA (Certified Medical Assistant) on 11/09/23 at 0813  Med List Status: <None>   Medication Order Taking? Sig Documenting Provider Last Dose Status Informant  albuterol  (PROAIR  HFA) 108 (90 Base) MCG/ACT inhaler [id number] Yes INHALE 2 PUFFS INTO THE LUNGS EVERY 6 HOURS AS NEEDED FOR WHEEZING OR SHORTNESS OF Bailey Bolus, NP Taking Active   budesonide -formoterol  (SYMBICORT ) 80-4.5 MCG/ACT inhaler [id number] Yes INHALE 2 PUFFS INTO THE LUNGS TWICE DAILY Nichols, Tonya S, NP Taking Active   gabapentin  (NEURONTIN ) 600 MG tablet [id number] Yes Take 1 tablet (600 mg total) by mouth 3 (three) times daily. Jerrlyn Morel, NP Taking Active   levothyroxine  (SYNTHROID ) 125 MCG tablet [id number] Yes Take 1 tablet (125 mcg total) by mouth daily. Jerrlyn Morel, NP Taking Active   omeprazole  (PRILOSEC) 20 MG capsule [id number] Yes Take 1 capsule (20 mg total) by mouth daily. Jerrlyn Morel, NP Taking Active   rosuvastatin  (CRESTOR ) 20 MG tablet [id number] Yes Take 1 tablet (20 mg total) by mouth daily. Jerrlyn Morel, NP Taking Active             Home Care and Equipment/Supplies: Were Home  Health Services Ordered?: No Any new equipment or medical supplies ordered?: No  Functional Questionnaire: Do you need assistance with bathing/showering or dressing?: No Do you need assistance with meal preparation?: No Do you need assistance with eating?: No Do you have difficulty maintaining continence: No Do you need assistance with getting out of bed/getting out of a chair/moving?: No Do you have difficulty managing or taking your medications?: No  Follow up appointments reviewed: PCP Follow-up appointment confirmed?: Yes Date of PCP follow-up appointment?: 11/12/23 Follow-up Provider: Abbey Hobby Specialist [facility] Follow-up appointment confirmed?: NA Do you need transportation to your follow-up appointment?: No  SDOH Interventions Today    Flowsheet Row Most Recent Value  SDOH Interventions   Food Insecurity Interventions Intervention Not Indicated  Housing Interventions Intervention Not Indicated  Transportation Interventions Intervention Not Indicated       SIGNATURE Stormy Sabol, RMA

## 2023-11-12 ENCOUNTER — Ambulatory Visit (INDEPENDENT_AMBULATORY_CARE_PROVIDER_SITE_OTHER): Payer: Self-pay | Admitting: Nurse Practitioner

## 2023-11-12 ENCOUNTER — Telehealth: Payer: Self-pay

## 2023-11-12 ENCOUNTER — Encounter: Payer: Self-pay | Admitting: Nurse Practitioner

## 2023-11-12 VITALS — BP 67/53 | HR 87 | Temp 98.4°F | Wt 145.2 lb

## 2023-11-12 DIAGNOSIS — I959 Hypotension, unspecified: Secondary | ICD-10-CM | POA: Diagnosis not present

## 2023-11-12 DIAGNOSIS — R9431 Abnormal electrocardiogram [ECG] [EKG]: Secondary | ICD-10-CM | POA: Diagnosis not present

## 2023-11-12 NOTE — Progress Notes (Signed)
 Subjective   Patient ID: Javier Bridges, male    DOB: 12/12/1960, 63 y.o.   MRN: [id number]  Chief Complaint  Patient presents with   Hospitalization Follow-up    Referring provider: Jerrlyn Morel, NP  Javier Bridges is a 63 y.o. male with Past Medical History: No date: Bradycardia No date: Chronic back pain No date: Hepatitis C, chronic (HCC)     Comment:  tx with harvoni No date: Hypothyroidism No date: Tobacco dependence  HPI  Patient presents today for an ED follow-up.  Patient states that he was in the ED on 11/04/2023 with low blood pressure.  He was given fluids and sent home.  It was noted in his labs that his thyroid  was abnormal but improved from last check.  We have placed a referral for him for endocrinology to follow-up for hypothyroidism but patient no showed for his most recent appointment.  We will give him the number today to call to make another appointment.  Patient states that he has been compliant with his medications.  His EKG in the ED was abnormal.  We will place a referral to cardiology.  Discussed with patient that if he starts feeling bad over the weekend with any lightheadedness or dizziness or weakness he does need to go straight to the emergency room for further evaluation.  He does need to stay well-hydrated. Denies f/c/s, n/v/d, hemoptysis, PND, leg swelling Denies chest pain or edema     Allergies  Allergen Reactions   Tricor  [Fenofibrate ]     Chest pain    Immunization History  Administered Date(s) Administered   Hepatitis A, Adult 12/15/2016   Pneumococcal Polysaccharide-23 05/03/2015   Tdap 09/30/2016    Tobacco History: Social History   Tobacco Use  Smoking Status Every Day   Current packs/day: 0.25   Types: Cigarettes  Smokeless Tobacco Never   Ready to quit: Yes Counseling given: Yes   Outpatient Encounter Medications as of 11/12/2023  Medication Sig   albuterol  (PROAIR  HFA) 108 (90 Base) MCG/ACT inhaler INHALE 2 PUFFS  INTO THE LUNGS EVERY 6 HOURS AS NEEDED FOR WHEEZING OR SHORTNESS OF BREATH   budesonide -formoterol  (SYMBICORT ) 80-4.5 MCG/ACT inhaler INHALE 2 PUFFS INTO THE LUNGS TWICE DAILY   gabapentin  (NEURONTIN ) 600 MG tablet Take 1 tablet (600 mg total) by mouth 3 (three) times daily.   levothyroxine  (SYNTHROID ) 125 MCG tablet Take 1 tablet (125 mcg total) by mouth daily.   omeprazole  (PRILOSEC) 20 MG capsule Take 1 capsule (20 mg total) by mouth daily.   rosuvastatin  (CRESTOR ) 20 MG tablet Take 1 tablet (20 mg total) by mouth daily.   No facility-administered encounter medications on file as of 11/12/2023.    Review of Systems  Review of Systems  Constitutional: Negative.   HENT: Negative.    Cardiovascular: Negative.   Gastrointestinal: Negative.   Allergic/Immunologic: Negative.   Neurological: Negative.   Psychiatric/Behavioral: Negative.       Objective:   BP (!) 67/53   Pulse 87   Temp 98.4 F (36.9 C) (Oral)   Wt 145 lb 3.2 oz (65.9 kg)   SpO2 95%   BMI 22.08 kg/m   Wt Readings from Last 5 Encounters:  11/12/23 145 lb 3.2 oz (65.9 kg)  11/04/23 150 lb (68 kg)  06/24/23 151 lb 12.8 oz (68.9 kg)  08/26/22 158 lb 3.2 oz (71.8 kg)  05/18/22 158 lb (71.7 kg)     Physical Exam Vitals and nursing note reviewed.  Constitutional:  General: He is not in acute distress.    Appearance: He is well-developed.  Cardiovascular:     Rate and Rhythm: Normal rate and regular rhythm.  Pulmonary:     Effort: Pulmonary effort is normal.     Breath sounds: Normal breath sounds.  Skin:    General: Skin is warm and dry.  Neurological:     Mental Status: He is alert and oriented to person, place, and time.       Assessment & Plan:   Hypotension, unspecified hypotension type -     Ambulatory referral to Cardiology -     CBC  Abnormal EKG -     Ambulatory referral to Cardiology    Patient Instructions  1. Hypotension, unspecified hypotension type (Primary)  - Ambulatory  referral to Cardiology - CBC  2. Abnormal EKG  - Ambulatory referral to Cardiology  Note: Discussed with patient that if he starts feeling bad over the weekend with any lightheadedness or dizziness or weakness he does need to go straight to the emergency room for further evaluation.   Return in about 3 months (around 02/12/2024).   Jerrlyn Morel, NP 11/12/2023

## 2023-11-12 NOTE — Telephone Encounter (Signed)
 Sent to [employer]

## 2023-11-12 NOTE — Patient Instructions (Signed)
 1. Hypotension, unspecified hypotension type (Primary)  - Ambulatory referral to Cardiology - CBC  2. Abnormal EKG  - Ambulatory referral to Cardiology  Note: Discussed with patient that if he starts feeling bad over the weekend with any lightheadedness or dizziness or weakness he does need to go straight to the emergency room for further evaluation.

## 2023-11-13 LAB — CBC
Hematocrit: 42.6 % (ref 37.5–51.0)
Hemoglobin: 13.8 g/dL (ref 13.0–17.7)
MCH: 29.3 pg (ref 26.6–33.0)
MCHC: 32.4 g/dL (ref 31.5–35.7)
MCV: 90 fL (ref 79–97)
Platelets: 398 10*3/uL (ref 150–450)
RBC: 4.71 x10E6/uL (ref 4.14–5.80)
RDW: 12.8 % (ref 11.6–15.4)
WBC: 12.2 10*3/uL — ABNORMAL HIGH (ref 3.4–10.8)

## 2023-11-18 ENCOUNTER — Ambulatory Visit: Payer: Self-pay

## 2023-12-01 ENCOUNTER — Encounter (HOSPITAL_BASED_OUTPATIENT_CLINIC_OR_DEPARTMENT_OTHER): Payer: Self-pay

## 2023-12-23 ENCOUNTER — Ambulatory Visit: Payer: Self-pay | Admitting: Nurse Practitioner

## 2023-12-30 ENCOUNTER — Ambulatory Visit: Payer: Self-pay | Admitting: Nurse Practitioner

## 2023-12-30 ENCOUNTER — Ambulatory Visit (INDEPENDENT_AMBULATORY_CARE_PROVIDER_SITE_OTHER): Payer: Self-pay | Admitting: Nurse Practitioner

## 2023-12-30 ENCOUNTER — Encounter: Payer: Self-pay | Admitting: Nurse Practitioner

## 2023-12-30 VITALS — BP 137/81 | HR 62 | Temp 97.8°F | Wt 151.0 lb

## 2023-12-30 DIAGNOSIS — E039 Hypothyroidism, unspecified: Secondary | ICD-10-CM | POA: Diagnosis not present

## 2023-12-30 DIAGNOSIS — E781 Pure hyperglyceridemia: Secondary | ICD-10-CM

## 2023-12-30 DIAGNOSIS — G8929 Other chronic pain: Secondary | ICD-10-CM

## 2023-12-30 DIAGNOSIS — M25551 Pain in right hip: Secondary | ICD-10-CM

## 2023-12-30 DIAGNOSIS — Z13220 Encounter for screening for lipoid disorders: Secondary | ICD-10-CM | POA: Diagnosis not present

## 2023-12-30 MED ORDER — ROSUVASTATIN CALCIUM 20 MG PO TABS
20.0000 mg | ORAL_TABLET | Freq: Every day | ORAL | 0 refills | Status: AC
Start: 2023-12-30 — End: 2024-12-29

## 2023-12-30 MED ORDER — PREDNISONE 20 MG PO TABS: 20.0000 mg | ORAL_TABLET | Freq: Every day | ORAL | 0 refills | Status: AC

## 2023-12-30 MED ORDER — KETOROLAC TROMETHAMINE 30 MG/ML IJ SOLN
30.0000 mg | Freq: Once | INTRAMUSCULAR | Status: AC
  Administered 2023-12-30: 30 mg via INTRAMUSCULAR

## 2023-12-30 NOTE — Progress Notes (Signed)
 Subjective   Patient ID: Javier Bridges, male    DOB: 02/13/1961, 63 y.o.   MRN: [id number]  Chief Complaint  Patient presents with   Medical Management of Chronic Issues    Referring provider: Oley Bascom RAMAN, NP  Javier Bridges is a 63 y.o. male with Past Medical History: No date: Bradycardia No date: Chronic back pain No date: Hepatitis C, chronic (HCC)     Comment:  tx with harvoni No date: Hypothyroidism No date: Tobacco dependence  HPI  Patient presents for a follow-up on hypothyroidism.  He states that he is feeling much better since his last dose change of Synthroid .  We will recheck labs today.  Overall patient has been doing well since last visit.   Patient states that he has been having chronic right hip pain that is worsening.  We will order x-ray today.  We will give Toradol  injection and order prednisone to start tomorrow.  Patient may need referral for orthopedics.  Denies f/c/s, n/v/d, hemoptysis, PND, leg swelling. Denies chest pain or edema.     Allergies  Allergen Reactions   Tricor  [Fenofibrate ]     Chest pain    Immunization History  Administered Date(s) Administered   Hepatitis A, Adult 12/15/2016   Pneumococcal Polysaccharide-23 05/03/2015   Tdap 09/30/2016    Tobacco History: Social History   Tobacco Use  Smoking Status Every Day   Current packs/day: 0.25   Types: Cigarettes  Smokeless Tobacco Never   Ready to quit: Not Answered Counseling given: Yes   Outpatient Encounter Medications as of 12/30/2023  Medication Sig   albuterol  (PROAIR  HFA) 108 (90 Base) MCG/ACT inhaler INHALE 2 PUFFS INTO THE LUNGS EVERY 6 HOURS AS NEEDED FOR WHEEZING OR SHORTNESS OF BREATH   budesonide -formoterol  (SYMBICORT ) 80-4.5 MCG/ACT inhaler INHALE 2 PUFFS INTO THE LUNGS TWICE DAILY   gabapentin  (NEURONTIN ) 600 MG tablet Take 1 tablet (600 mg total) by mouth 3 (three) times daily.   levothyroxine  (SYNTHROID ) 125 MCG tablet Take 1 tablet (125 mcg total) by  mouth daily.   omeprazole  (PRILOSEC) 20 MG capsule Take 1 capsule (20 mg total) by mouth daily.   predniSONE (DELTASONE) 20 MG tablet Take 1 tablet (20 mg total) by mouth daily with breakfast for 5 days.   [DISCONTINUED] rosuvastatin  (CRESTOR ) 20 MG tablet Take 1 tablet (20 mg total) by mouth daily.   rosuvastatin  (CRESTOR ) 20 MG tablet Take 1 tablet (20 mg total) by mouth daily.   Facility-Administered Encounter Medications as of 12/30/2023  Medication   ketorolac  (TORADOL ) 30 MG/ML injection 30 mg    Review of Systems  Review of Systems  Constitutional: Negative.   HENT: Negative.    Cardiovascular: Negative.   Gastrointestinal: Negative.   Allergic/Immunologic: Negative.   Neurological: Negative.   Psychiatric/Behavioral: Negative.       Objective:   BP 137/81   Pulse 62   Temp 97.8 F (36.6 C) (Oral)   Wt 151 lb (68.5 kg)   SpO2 95%   BMI 22.96 kg/m   Wt Readings from Last 5 Encounters:  12/30/23 151 lb (68.5 kg)  11/12/23 145 lb 3.2 oz (65.9 kg)  11/04/23 150 lb (68 kg)  06/24/23 151 lb 12.8 oz (68.9 kg)  08/26/22 158 lb 3.2 oz (71.8 kg)     Physical Exam Vitals and nursing note reviewed.  Constitutional:      General: He is not in acute distress.    Appearance: He is well-developed.   Cardiovascular:  Rate and Rhythm: Normal rate and regular rhythm.  Pulmonary:     Effort: Pulmonary effort is normal.     Breath sounds: Normal breath sounds.   Skin:    General: Skin is warm and dry.   Neurological:     Mental Status: He is alert and oriented to person, place, and time.       Assessment & Plan:   Chronic right hip pain -     DG HIP UNILAT W OR W/O PELVIS 2-3 VIEWS RIGHT -     Ketorolac  Tromethamine  -     predniSONE; Take 1 tablet (20 mg total) by mouth daily with breakfast for 5 days.  Dispense: 5 tablet; Refill: 0  Lipid screening -     Lipid panel  Acquired hypothyroidism -     Thyroid  Panel With TSH -     CBC -     Comprehensive  metabolic panel with GFR  Hypertriglyceridemia -     Rosuvastatin  Calcium ; Take 1 tablet (20 mg total) by mouth daily.  Dispense: 90 tablet; Refill: 0     Return in about 6 months (around 06/30/2024).   Bascom GORMAN Borer, NP 12/30/2023

## 2023-12-30 NOTE — Patient Instructions (Signed)
 Hip Pain The hip is the joint between the upper legs and the lower pelvis. The bones, cartilage, tendons, and muscles of your hip joint support your body and allow you to move around. Hip pain can range from a minor ache to severe pain in one or both of your hips. The pain may be felt on the inside of the hip joint near the groin, or on the outside near the buttocks and upper thigh. You may also have swelling or stiffness in your hip area. Follow these instructions at home: Managing pain, stiffness, and swelling     If told, put ice on the painful area. Put ice in a plastic bag. Place a towel between your skin and the bag. Leave the ice on for 20 minutes, 2-3 times a day. If told, apply heat to the affected area as often as told by your health care provider. Use the heat source that your provider recommends, such as a moist heat pack or a heating pad. Place a towel between your skin and the heat source. Leave the heat on for 20-30 minutes. If your skin turns bright red, remove the ice or heat right away to prevent skin damage. The risk of damage is higher if you cannot feel pain, heat, or cold. Activity Do exercises as told by your provider. Avoid activities that cause pain. General instructions  Take over-the-counter and prescription medicines only as told by your provider. Keep a journal of your symptoms. Write down: How often you have hip pain. The location of your pain. What the pain feels like. What makes the pain worse. Sleep with a pillow between your legs on your most comfortable side. Keep all follow-up visits. Your provider will monitor your pain and activity. Contact a health care provider if: You cannot put weight on your leg. Your pain or swelling gets worse after a week. It gets harder to walk. You have a fever. Get help right away if: You fall. You have a sudden increase in pain and swelling in your hip. Your hip is red or swollen or very tender to touch. This  information is not intended to replace advice given to you by your health care provider. Make sure you discuss any questions you have with your health care provider. Document Revised: 02/24/2022 Document Reviewed: 02/24/2022 Elsevier Patient Education  2024 [employer].

## 2023-12-31 ENCOUNTER — Ambulatory Visit: Payer: Self-pay | Admitting: Nurse Practitioner

## 2023-12-31 LAB — COMPREHENSIVE METABOLIC PANEL WITH GFR
ALT: 9 IU/L (ref 0–44)
AST: 16 IU/L (ref 0–40)
Albumin: 3.8 g/dL — ABNORMAL LOW (ref 3.9–4.9)
Alkaline Phosphatase: 113 IU/L (ref 44–121)
BUN/Creatinine Ratio: 10 (ref 10–24)
BUN: 9 mg/dL (ref 8–27)
Bilirubin Total: <0.2 mg/dL (ref 0.0–1.2)
CO2: 25 mmol/L (ref 20–29)
Calcium: 9.6 mg/dL (ref 8.6–10.2)
Chloride: 103 mmol/L (ref 96–106)
Creatinine, Ser: 0.88 mg/dL (ref 0.76–1.27)
Globulin, Total: 2.7 g/dL (ref 1.5–4.5)
Glucose: 100 mg/dL — ABNORMAL HIGH (ref 70–99)
Potassium: 4.5 mmol/L (ref 3.5–5.2)
Sodium: 140 mmol/L (ref 134–144)
Total Protein: 6.5 g/dL (ref 6.0–8.5)
eGFR: 95 mL/min/{1.73_m2} (ref >59)

## 2023-12-31 LAB — CBC
Hematocrit: 42.3 % (ref 37.5–51.0)
Hemoglobin: 13.5 g/dL (ref 13.0–17.7)
MCH: 29.4 pg (ref 26.6–33.0)
MCHC: 31.9 g/dL (ref 31.5–35.7)
MCV: 92 fL (ref 79–97)
Platelets: 371 10*3/uL (ref 150–450)
RBC: 4.59 x10E6/uL (ref 4.14–5.80)
RDW: 13.8 % (ref 11.6–15.4)
WBC: 10.6 10*3/uL (ref 3.4–10.8)

## 2023-12-31 LAB — THYROID PANEL WITH TSH
Free Thyroxine Index: 3.1 (ref 1.2–4.9)
T3 Uptake Ratio: 35 % (ref 24–39)
T4, Total: 8.9 ug/dL (ref 4.5–12.0)
TSH: 0.58 u[IU]/mL (ref 0.450–4.500)

## 2023-12-31 LAB — LIPID PANEL
Chol/HDL Ratio: 2.9 ratio (ref 0.0–5.0)
Cholesterol, Total: 140 mg/dL (ref 100–199)
HDL: 49 mg/dL (ref >39)
LDL Chol Calc (NIH): 78 mg/dL (ref 0–99)
Triglycerides: 63 mg/dL (ref 0–149)
VLDL Cholesterol Cal: 13 mg/dL (ref 5–40)

## 2024-01-12 ENCOUNTER — Other Ambulatory Visit: Payer: Self-pay | Admitting: Nurse Practitioner

## 2024-01-12 DIAGNOSIS — K219 Gastro-esophageal reflux disease without esophagitis: Secondary | ICD-10-CM

## 2024-01-20 ENCOUNTER — Encounter: Payer: Self-pay | Admitting: "Endocrinology

## 2024-01-20 ENCOUNTER — Ambulatory Visit (INDEPENDENT_AMBULATORY_CARE_PROVIDER_SITE_OTHER): Admitting: "Endocrinology

## 2024-01-20 VITALS — BP 114/80 | HR 85 | Ht 68.0 in | Wt 152.0 lb

## 2024-01-20 DIAGNOSIS — F17200 Nicotine dependence, unspecified, uncomplicated: Secondary | ICD-10-CM

## 2024-01-20 DIAGNOSIS — E039 Hypothyroidism, unspecified: Secondary | ICD-10-CM | POA: Diagnosis not present

## 2024-01-20 NOTE — Addendum Note (Signed)
 Addended by: Shani Fitch on: 01/20/2024 03:54 PM   Modules accepted: Level of Service

## 2024-01-20 NOTE — Progress Notes (Addendum)
 Outpatient Endocrinology Note Javier Birmingham, MD  01/20/24   Javier Bridges 1961/01/06 [id number]  Referring Provider: Oley Bridges RAMAN, NP Primary Care Provider: Oley Bridges RAMAN, NP Subjective  No chief complaint on file.   Assessment & Plan  Diagnoses and all orders for this visit:  Acquired hypothyroidism -     TSH + free T4 -     TSH + free T4  Smoker    Javier Bridges is currently taking levothyroxine  125 mcg po every day. Patient is currently biochemically euthyroid.  Educated on thyroid  axis.  Recommend the following: Take levothyroxine  125 mcg po every morning.  Advised to take levothyroxine  first thing in the morning on empty stomach and wait at least 30 minutes to 1 hour before eating or drinking anything or taking any other medications. Space out levothyroxine  by 4 hours from any acid reflux medication/fibrate/iron/calcium /multivitamin. Advised to take nutritional supplements in the evening. Repeat lab before next visit or sooner if symptoms of hyperthyroidism or hypothyroidism develop.  Notify us  immediately in case of significant weight gain or loss. Counseled on compliance and follow up needs.  The patient was advised to quit smoking.  Reviewed strategies to maximize success, including smoking materials from environment, stress management, support of family/friends, written materials and local smoking cessation programs. Encouraged to contact PCP for any help needed such as patches or pills to curb nicotine  dependence. Patient understands and agreed   I have reviewed current medications, nurse's notes, allergies, vital signs, past medical and surgical history, family medical history, and social history for this encounter. Counseled patient on symptoms, examination findings, lab findings, imaging results, treatment decisions and monitoring and prognosis. The patient understood the recommendations and agrees with the treatment plan. All questions regarding  treatment plan were fully answered.   Return in about 4 months (around 05/22/2024) for visit + labs before next visit, labs.   Javier Birmingham, MD  01/20/24   I have reviewed current medications, nurse's notes, allergies, vital signs, past medical and surgical history, family medical history, and social history for this encounter. Counseled patient on symptoms, examination findings, lab findings, imaging results, treatment decisions and monitoring and prognosis. The patient understood the recommendations and agrees with the treatment plan. All questions regarding treatment plan were fully answered.   History of Present Illness Javier Bridges is a 63 y.o. year old male who presents to our clinic with hypothyroidism diagnosed long ago, years unknown.   On levothyroxine  125 mcg po every day  Symptoms suggestive of HYPOTHYROIDISM:  fatigue No weight gain No cold intolerance  No constipation  No  Symptoms suggestive of HYPERTHYROIDISM:  weight loss  No heat intolerance No hyperdefecation  No palpitations  No  Compressive symptoms:  dysphagia  No dysphonia  No positional dyspnea (especially with simultaneous arms elevation)  No  Smokes  Yes On biotin  No Personal history of head/neck surgery/irradiation  No  Physical Exam  BP 114/80   Pulse 85   Ht 5' 8 (1.727 m)   Wt 152 lb (68.9 kg)   SpO2 98%   BMI 23.11 kg/m  Constitutional: well developed, well nourished Head: normocephalic, atraumatic, no exophthalmos Eyes: sclera anicteric, no redness Neck: no thyromegaly, no thyroid  tenderness; no nodules palpated Lungs: normal respiratory effort Neurology: alert and oriented, no fine hand tremor Skin: dry, no appreciable rashes Musculoskeletal: no appreciable defects Psychiatric: normal mood and affect  Allergies Allergies  Allergen Reactions   Tricor  [Fenofibrate ]  Chest pain    Current Medications Patient's Medications  New Prescriptions   No medications on  file  Previous Medications   ALBUTEROL  (PROAIR  HFA) 108 (90 BASE) MCG/ACT INHALER    INHALE 2 PUFFS INTO THE LUNGS EVERY 6 HOURS AS NEEDED FOR WHEEZING OR SHORTNESS OF BREATH   BUDESONIDE -FORMOTEROL  (SYMBICORT ) 80-4.5 MCG/ACT INHALER    INHALE 2 PUFFS INTO THE LUNGS TWICE DAILY   GABAPENTIN  (NEURONTIN ) 600 MG TABLET    Take 1 tablet (600 mg total) by mouth 3 (three) times daily.   LEVOTHYROXINE  (SYNTHROID ) 125 MCG TABLET    Take 1 tablet (125 mcg total) by mouth daily.   OMEPRAZOLE  (PRILOSEC) 20 MG CAPSULE    TAKE 1 CAPSULE(20 MG) BY MOUTH DAILY   ROSUVASTATIN  (CRESTOR ) 20 MG TABLET    Take 1 tablet (20 mg total) by mouth daily.  Modified Medications   No medications on file  Discontinued Medications   No medications on file    Past Medical History Past Medical History:  Diagnosis Date   Bradycardia    Chronic back pain    Hepatitis C, chronic (HCC)    tx with harvoni   Hypothyroidism    Tobacco dependence     Past Surgical History Past Surgical History:  Procedure Laterality Date   ANTERIOR LATERAL LUMBAR FUSION 4 LEVELS N/A 09/13/2017   Procedure: Lumbar one-two Lumbar two-three Lumbar three-four Lumbar four-five Anterolateral decompression/fusion with Jessica;  Surgeon: Colon Shove, MD;  Location: [facility] OR;  Service: Neurosurgery;  Laterality: N/A;   APPLICATION OF ROBOTIC ASSISTANCE FOR SPINAL PROCEDURE N/A 09/13/2017   Procedure: lumbar one to lumbar five percutaneous screws APPLICATION OF ROBOTIC ASSISTANCE FOR SPINAL PROCEDURE;  Surgeon: Colon Shove, MD;  Location: [facility] OR;  Service: Neurosurgery;  Laterality: N/A;   Feet Surgery  1970   MULTIPLE TOOTH EXTRACTIONS      Family History family history includes Cancer in his father; Hypertension in his mother.  Social History Social History   Socioeconomic History   Marital status: Single    Spouse name: Not on file   Number of children: Not on file   Years of education: Not on file   Highest education level: Not on file   Occupational History   Not on file  Tobacco Use   Smoking status: Every Day    Current packs/day: 0.25    Types: Cigarettes   Smokeless tobacco: Never  Vaping Use   Vaping status: Never Used  Substance and Sexual Activity   Alcohol use: Yes    Alcohol/week: 14.0 standard drinks of alcohol    Types: 14 Cans of beer per week    Comment: socially   Drug use: Yes    Types: Marijuana    Comment: last time was 2/28   Sexual activity: Never  Other Topics Concern   Not on file  Social History Narrative   Not on file   Social Drivers of Health   Financial Resource Strain: Not on file  Food Insecurity: No Food Insecurity (11/09/2023)   Hunger Vital Sign    Worried About Running Out of Food in the Last Year: Never true    Ran Out of Food in the Last Year: Never true  Transportation Needs: No Transportation Needs (11/09/2023)   PRAPARE - Administrator, Civil Service (Medical): No    Lack of Transportation (Non-Medical): No  Physical Activity: Not on file  Stress: Not on file  Social Connections: Not on file  Intimate Partner Violence:  Not on file    Laboratory Investigations Lab Results  Component Value Date   TSH 0.580 12/30/2023   TSH 1.480 08/05/2023   TSH 63.000 (H) 06/24/2023   FREET4 0.25 (L) 08/25/2021   FREET4 <0.10 (L) 02/29/2020   FREET4 0.56 (L) 11/29/2019     No results found for: TSI   No components found for: TRAB   Lab Results  Component Value Date   CHOL 140 12/30/2023   Lab Results  Component Value Date   HDL 49 12/30/2023   Lab Results  Component Value Date   LDLCALC 78 12/30/2023   Lab Results  Component Value Date   TRIG 63 12/30/2023   Lab Results  Component Value Date   CHOLHDL 2.9 12/30/2023   Lab Results  Component Value Date   CREATININE 0.88 12/30/2023   No results found for: GFR    Component Value Date/Time   NA 140 12/30/2023 1051   K 4.5 12/30/2023 1051   CL 103 12/30/2023 1051   CO2 25 12/30/2023 1051    GLUCOSE 100 (H) 12/30/2023 1051   GLUCOSE 135 (H) 11/04/2023 1220   BUN 9 12/30/2023 1051   CREATININE 0.88 12/30/2023 1051   CREATININE 0.98 02/01/2017 0917   CALCIUM  9.6 12/30/2023 1051   PROT 6.5 12/30/2023 1051   ALBUMIN 3.8 (L) 12/30/2023 1051   AST 16 12/30/2023 1051   ALT 9 12/30/2023 1051   ALT 20 11/16/2016 1158   ALKPHOS 113 12/30/2023 1051   BILITOT <0.2 12/30/2023 1051   GFRNONAA >60 11/04/2023 1220   GFRNONAA 85 02/01/2017 0917   GFRAA 68 07/15/2020 1209   GFRAA >89 02/01/2017 0917      Latest Ref Rng & Units 12/30/2023   10:51 AM 11/04/2023   12:20 PM 06/24/2023   10:45 AM  BMP  Glucose 70 - 99 mg/dL 899  864  886   BUN 8 - 27 mg/dL 9  11  12    Creatinine 0.76 - 1.27 mg/dL 9.11  8.79  9.06   BUN/Creat Ratio 10 - 24 10   13    Sodium 134 - 144 mmol/L 140  140  136   Potassium 3.5 - 5.2 mmol/L 4.5  4.0  4.4   Chloride 96 - 106 mmol/L 103  108  101   CO2 20 - 29 mmol/L 25  24  22    Calcium  8.6 - 10.2 mg/dL 9.6  9.2  9.4        Component Value Date/Time   WBC 10.6 12/30/2023 1051   WBC 12.4 (H) 11/04/2023 1220   RBC 4.59 12/30/2023 1051   RBC 4.47 11/04/2023 1220   HGB 13.5 12/30/2023 1051   HCT 42.3 12/30/2023 1051   PLT 371 12/30/2023 1051   MCV 92 12/30/2023 1051   MCH 29.4 12/30/2023 1051   MCH 29.3 11/04/2023 1220   MCHC 31.9 12/30/2023 1051   MCHC 32.2 11/04/2023 1220   RDW 13.8 12/30/2023 1051   LYMPHSABS 3.0 09/01/2019 1059   MONOABS 770 09/30/2016 1154   EOSABS 0.7 (H) 09/01/2019 1059   BASOSABS 0.1 09/01/2019 1059      Parts of this note may have been dictated using voice recognition software. There may be variances in spelling and vocabulary which are unintentional. Not all errors are proofread. Please notify the dino if any discrepancies are noted or if the meaning of any statement is not clear.

## 2024-05-04 ENCOUNTER — Other Ambulatory Visit: Payer: Self-pay | Admitting: Nurse Practitioner

## 2024-05-04 NOTE — Telephone Encounter (Signed)
 Please advise [facility]

## 2024-05-17 ENCOUNTER — Other Ambulatory Visit

## 2024-05-22 ENCOUNTER — Ambulatory Visit: Admitting: "Endocrinology

## 2024-06-30 ENCOUNTER — Ambulatory Visit (INDEPENDENT_AMBULATORY_CARE_PROVIDER_SITE_OTHER): Payer: Self-pay | Admitting: Nurse Practitioner

## 2024-06-30 ENCOUNTER — Encounter: Payer: Self-pay | Admitting: Nurse Practitioner

## 2024-06-30 VITALS — BP 81/56 | HR 91 | Temp 97.1°F | Wt 147.6 lb

## 2024-06-30 DIAGNOSIS — E039 Hypothyroidism, unspecified: Secondary | ICD-10-CM | POA: Diagnosis not present

## 2024-06-30 DIAGNOSIS — G8929 Other chronic pain: Secondary | ICD-10-CM

## 2024-06-30 DIAGNOSIS — I959 Hypotension, unspecified: Secondary | ICD-10-CM | POA: Diagnosis not present

## 2024-06-30 DIAGNOSIS — M25551 Pain in right hip: Secondary | ICD-10-CM | POA: Diagnosis not present

## 2024-06-30 DIAGNOSIS — Z13220 Encounter for screening for lipoid disorders: Secondary | ICD-10-CM | POA: Diagnosis not present

## 2024-06-30 MED ORDER — DICLOFENAC SODIUM 75 MG PO TBEC: 75.0000 mg | DELAYED_RELEASE_TABLET | Freq: Two times a day (BID) | ORAL | 0 refills | Status: AC

## 2024-06-30 MED ORDER — OMEPRAZOLE 40 MG PO CPDR: 40.0000 mg | DELAYED_RELEASE_CAPSULE | Freq: Every day | ORAL | 3 refills | Status: AC

## 2024-06-30 MED ORDER — GABAPENTIN 600 MG PO TABS: 600.0000 mg | ORAL_TABLET | Freq: Three times a day (TID) | ORAL | 0 refills | Status: AC

## 2024-06-30 MED ORDER — LEVOTHYROXINE SODIUM 125 MCG PO TABS: 125.0000 ug | ORAL_TABLET | Freq: Every day | ORAL | 11 refills | Status: AC

## 2024-06-30 NOTE — Progress Notes (Signed)
 "  Subjective   Patient ID: Javier Bridges, male    DOB: September 26, 1960, 63 y.o.   MRN: [id number]  Chief Complaint  Patient presents with   Follow-up    6 month. Pain is now down to the ankles and not able to sleep. Would like heart burn medication increased to 40 mg.     Referring provider: Oley Javier RAMAN, NP  Javier Bridges Plaza is a 63 y.o. male with Past Medical History: No date: Bradycardia No date: Chronic back pain No date: Hepatitis C, chronic (HCC)     Comment:  tx with harvoni No date: Hypothyroidism No date: Tobacco dependence   HPI  Patient presents for a follow-up on hypothyroidism.  He states that he is feeling much better since his last dose change of Synthroid .  We will recheck labs today.  Overall patient has been doing well since last visit.   Patient states that he has been having chronic right hip pain that is worsening.  We will order x-ray today.   Patient may need referral for orthopedics.  Denies f/c/s, n/v/d, hemoptysis, PND, leg swelling. Denies chest pain or edema.    Allergies[1]  Immunization History  Administered Date(s) Administered   Hepatitis A, Adult 12/15/2016   Pneumococcal Polysaccharide-23 05/03/2015   Tdap 09/30/2016    Tobacco History: Tobacco Use History[2] Ready to quit: Not Answered Counseling given: Not Answered   Outpatient Encounter Medications as of 06/30/2024  Medication Sig   albuterol  (PROAIR  HFA) 108 (90 Base) MCG/ACT inhaler INHALE 2 PUFFS INTO THE LUNGS EVERY 6 HOURS AS NEEDED FOR WHEEZING OR SHORTNESS OF BREATH   budesonide -formoterol  (SYMBICORT ) 80-4.5 MCG/ACT inhaler INHALE 2 PUFFS INTO THE LUNGS TWICE DAILY   diclofenac  (VOLTAREN ) 75 MG EC tablet Take 1 tablet (75 mg total) by mouth 2 (two) times daily.   omeprazole  (PRILOSEC) 40 MG capsule Take 1 capsule (40 mg total) by mouth daily.   rosuvastatin  (CRESTOR ) 20 MG tablet Take 1 tablet (20 mg total) by mouth daily.   [DISCONTINUED] gabapentin  (NEURONTIN ) 600 MG tablet  Take 1 tablet (600 mg total) by mouth 3 (three) times daily.   [DISCONTINUED] levothyroxine  (SYNTHROID ) 125 MCG tablet Take 1 tablet (125 mcg total) by mouth daily.   [DISCONTINUED] omeprazole  (PRILOSEC) 20 MG capsule TAKE 1 CAPSULE(20 MG) BY MOUTH DAILY   gabapentin  (NEURONTIN ) 600 MG tablet Take 1 tablet (600 mg total) by mouth 3 (three) times daily.   levothyroxine  (SYNTHROID ) 125 MCG tablet Take 1 tablet (125 mcg total) by mouth daily.   No facility-administered encounter medications on file as of 06/30/2024.    Review of Systems  Review of Systems  Constitutional: Negative.   HENT: Negative.    Cardiovascular: Negative.   Gastrointestinal: Negative.   Allergic/Immunologic: Negative.   Neurological: Negative.   Psychiatric/Behavioral: Negative.       Objective:   BP (!) 83/63 (BP Location: Right Arm, Patient Position: Sitting, Cuff Size: Normal)   Pulse 91   Temp (!) 97.1 F (36.2 C) (Temporal)   Wt 147 lb 9.6 oz (67 kg)   SpO2 97%   BMI 22.44 kg/m   Wt Readings from Last 5 Encounters:  06/30/24 147 lb 9.6 oz (67 kg)  01/20/24 152 lb (68.9 kg)  12/30/23 151 lb (68.5 kg)  11/12/23 145 lb 3.2 oz (65.9 kg)  11/04/23 150 lb (68 kg)     Physical Exam Vitals and nursing note reviewed.  Constitutional:      General: He is not in  acute distress.    Appearance: He is well-developed.  Cardiovascular:     Rate and Rhythm: Normal rate and regular rhythm.  Pulmonary:     Effort: Pulmonary effort is normal.     Breath sounds: Normal breath sounds.  Skin:    General: Skin is warm and dry.  Neurological:     Mental Status: He is alert and oriented to person, place, and time.       Assessment & Plan:   Hypotension, unspecified hypotension type -     CBC -     Comprehensive metabolic panel with GFR  Acquired hypothyroidism -     Levothyroxine  Sodium; Take 1 tablet (125 mcg total) by mouth daily.  Dispense: 30 tablet; Refill: 11 -     Thyroid  Panel With  TSH  Chronic right hip pain -     DG HIP UNILAT W OR W/O PELVIS 2-3 VIEWS RIGHT -     Diclofenac  Sodium; Take 1 tablet (75 mg total) by mouth 2 (two) times daily.  Dispense: 30 tablet; Refill: 0  Lipid screening -     Lipid panel  Other orders -     Gabapentin ; Take 1 tablet (600 mg total) by mouth 3 (three) times daily.  Dispense: 270 tablet; Refill: 0 -     Omeprazole ; Take 1 capsule (40 mg total) by mouth daily.  Dispense: 30 capsule; Refill: 3     Return in about 6 months (around 12/29/2024).    Javier GORMAN Borer, NP 06/30/2024     [1]  Allergies Allergen Reactions   Tricor  [Fenofibrate ]     Chest pain  [2]  Social History Tobacco Use  Smoking Status Every Day   Current packs/day: 0.25   Types: Cigarettes  Smokeless Tobacco Never   "

## 2024-07-01 LAB — CBC
Hematocrit: 43.9 % (ref 37.5–51.0)
Hemoglobin: 13.8 g/dL (ref 13.0–17.7)
MCH: 27.9 pg (ref 26.6–33.0)
MCHC: 31.4 g/dL — ABNORMAL LOW (ref 31.5–35.7)
MCV: 89 fL (ref 79–97)
Platelets: 493 x10E3/uL — ABNORMAL HIGH (ref 150–450)
RBC: 4.95 x10E6/uL (ref 4.14–5.80)
RDW: 13.9 % (ref 11.6–15.4)
WBC: 12.6 x10E3/uL — ABNORMAL HIGH (ref 3.4–10.8)

## 2024-07-01 LAB — COMPREHENSIVE METABOLIC PANEL WITH GFR
ALT: 16 IU/L (ref 0–44)
AST: 21 IU/L (ref 0–40)
Albumin: 4 g/dL (ref 3.9–4.9)
Alkaline Phosphatase: 116 IU/L (ref 47–123)
BUN/Creatinine Ratio: 13 (ref 10–24)
BUN: 14 mg/dL (ref 8–27)
Bilirubin Total: 0.3 mg/dL (ref 0.0–1.2)
CO2: 21 mmol/L (ref 20–29)
Calcium: 10.2 mg/dL (ref 8.6–10.2)
Chloride: 100 mmol/L (ref 96–106)
Creatinine, Ser: 1.11 mg/dL (ref 0.76–1.27)
Globulin, Total: 3.6 g/dL (ref 1.5–4.5)
Glucose: 109 mg/dL — ABNORMAL HIGH (ref 70–99)
Potassium: 4.7 mmol/L (ref 3.5–5.2)
Sodium: 136 mmol/L (ref 134–144)
Total Protein: 7.6 g/dL (ref 6.0–8.5)
eGFR: 74 mL/min/1.73

## 2024-07-01 LAB — LIPID PANEL
Chol/HDL Ratio: 4.5 ratio (ref 0.0–5.0)
Cholesterol, Total: 193 mg/dL (ref 100–199)
HDL: 43 mg/dL
LDL Chol Calc (NIH): 118 mg/dL — ABNORMAL HIGH (ref 0–99)
Triglycerides: 184 mg/dL — ABNORMAL HIGH (ref 0–149)
VLDL Cholesterol Cal: 32 mg/dL (ref 5–40)

## 2024-07-01 LAB — THYROID PANEL WITH TSH
Free Thyroxine Index: 2.3 (ref 1.2–4.9)
T3 Uptake Ratio: 28 % (ref 24–39)
T4, Total: 8.1 ug/dL (ref 4.5–12.0)
TSH: 3.84 u[IU]/mL (ref 0.450–4.500)

## 2024-07-04 ENCOUNTER — Ambulatory Visit: Payer: Self-pay | Admitting: Nurse Practitioner

## 2024-07-11 NOTE — Telephone Encounter (Signed)
 Copied from CRM [id number]. Topic: General - Deceased Patient >> July 16, 2024  9:53 AM Shanda MATSU wrote: Name of caller: Arland w/[facility]  Date of death: 12-Jul-2024   Name of funeral home: [facility]  Phone number of funeral home: [phone]  Provider that needs to sign form: death certificate  Timeline for signing: ASAP

## 2024-07-12 ENCOUNTER — Telehealth: Payer: Self-pay

## 2024-07-12 NOTE — Telephone Encounter (Signed)
 Spoke with Javier Bridges and advised that provider has completed the death certificate. Javier Bridges states that she just received it not even 5 minutes ago. No further action needed at this time. CB.

## 2024-07-12 NOTE — Telephone Encounter (Signed)
 Copied from CRM [id number]. Topic: General - Deceased Patient >> 2024/07/14  9:53 AM Shanda MATSU wrote: Name of caller: Arland w/[facility]  Date of death: 07/10/24   Name of funeral home: [facility]  Phone number of funeral home: [phone]  Provider that needs to sign form: death certificate  Timeline for signing: ASAP >> Jul 12, 2024 11:58 AM Donna BRAVO wrote: Arland called in stating the death certificate needs signed right away.   Arland stated a family member called yesterday 07/12/24 around around 9:30am, who ever  the family member spoke to, was very rude and they hung up on family member.

## 2024-08-06 DEATH — deceased

## 2024-12-29 ENCOUNTER — Ambulatory Visit: Payer: Self-pay | Admitting: Nurse Practitioner
